# Patient Record
Sex: Male | Born: 1950 | Race: Black or African American | Hispanic: No | Smoking: Former smoker
Health system: Southern US, Community
[De-identification: ages and names within clinical notes are randomized; demographics above are authoritative.]

## PROBLEM LIST (undated history)

## (undated) DIAGNOSIS — M4804 Spinal stenosis, thoracic region: Secondary | ICD-10-CM

## (undated) DIAGNOSIS — K219 Gastro-esophageal reflux disease without esophagitis: Secondary | ICD-10-CM

## (undated) DIAGNOSIS — N400 Enlarged prostate without lower urinary tract symptoms: Secondary | ICD-10-CM

## (undated) DIAGNOSIS — I10 Essential (primary) hypertension: Secondary | ICD-10-CM

## (undated) DIAGNOSIS — M792 Neuralgia and neuritis, unspecified: Secondary | ICD-10-CM

## (undated) DIAGNOSIS — H269 Unspecified cataract: Secondary | ICD-10-CM

## (undated) DIAGNOSIS — E785 Hyperlipidemia, unspecified: Secondary | ICD-10-CM

## (undated) HISTORY — DX: Benign prostatic hyperplasia without lower urinary tract symptoms: N40.0

## (undated) HISTORY — DX: Hyperlipidemia, unspecified: E78.5

## (undated) HISTORY — DX: Essential (primary) hypertension: I10

## (undated) HISTORY — DX: Gastro-esophageal reflux disease without esophagitis: K21.9

## (undated) HISTORY — PX: KNEE SURGERY: SHX244

## (undated) HISTORY — PX: BACK SURGERY: SHX140

## (undated) HISTORY — DX: Unspecified cataract: H26.9

---

## 2000-07-18 ENCOUNTER — Emergency Department (HOSPITAL_COMMUNITY): Admission: EM | Admit: 2000-07-18 | Discharge: 2000-07-18 | Payer: Self-pay | Admitting: Emergency Medicine

## 2009-01-13 IMAGING — CR DG ANKLE COMPLETE 3+V*L*
1 series · 4 of 4 positions shown · non-contrast
Comparison: none

REASON FOR EXAM: one leg shoter than the other pain when standinf DDS fax
[CR]-[PHONE_NUMBER]
COMMENTS:

PROCEDURE:     DXR - DXR ANKLE LEFT COMPLETE  - [DATE] [DATE]
RESULT:     No fracture, dislocation or other acute bony abnormality is
identified. The ankle mortise is well maintained.

[Series 1: view not recorded · 0.17mm/px · 4 of 4 slices shown]
[im 1/4]
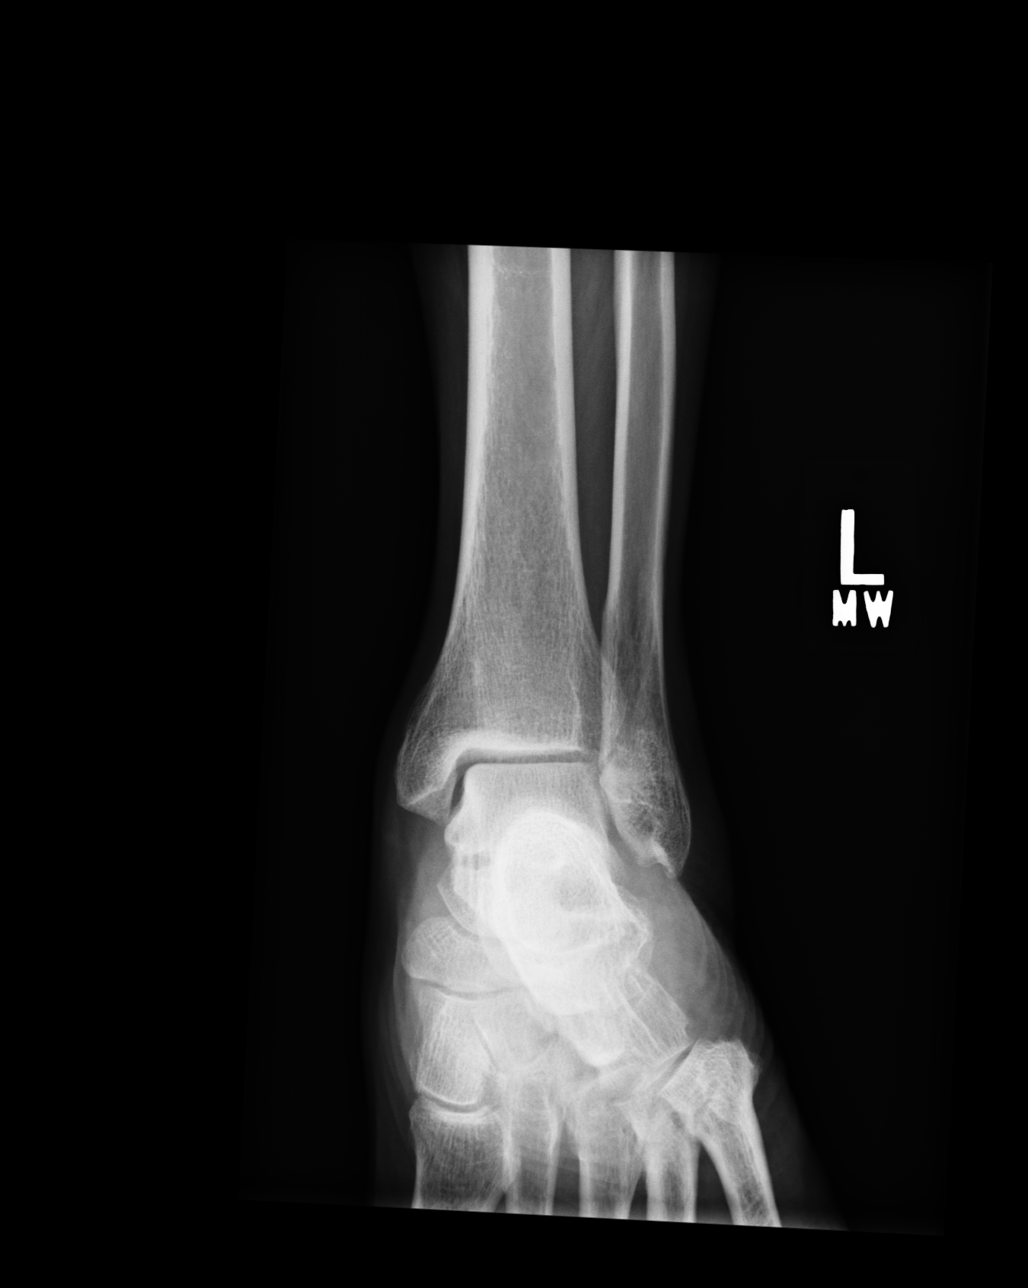
[im 2/4]
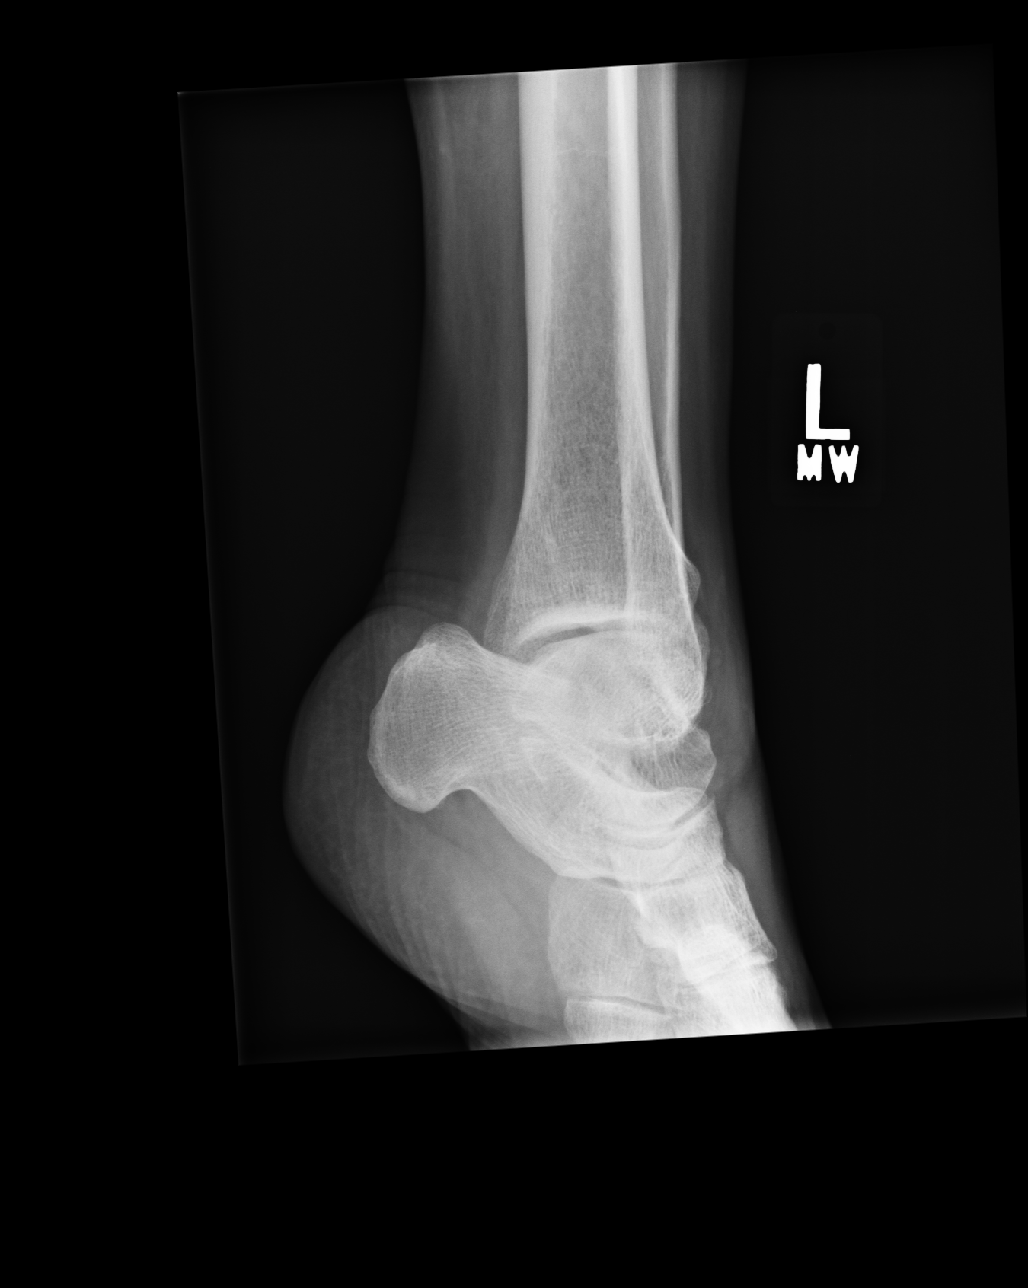
[im 3/4]
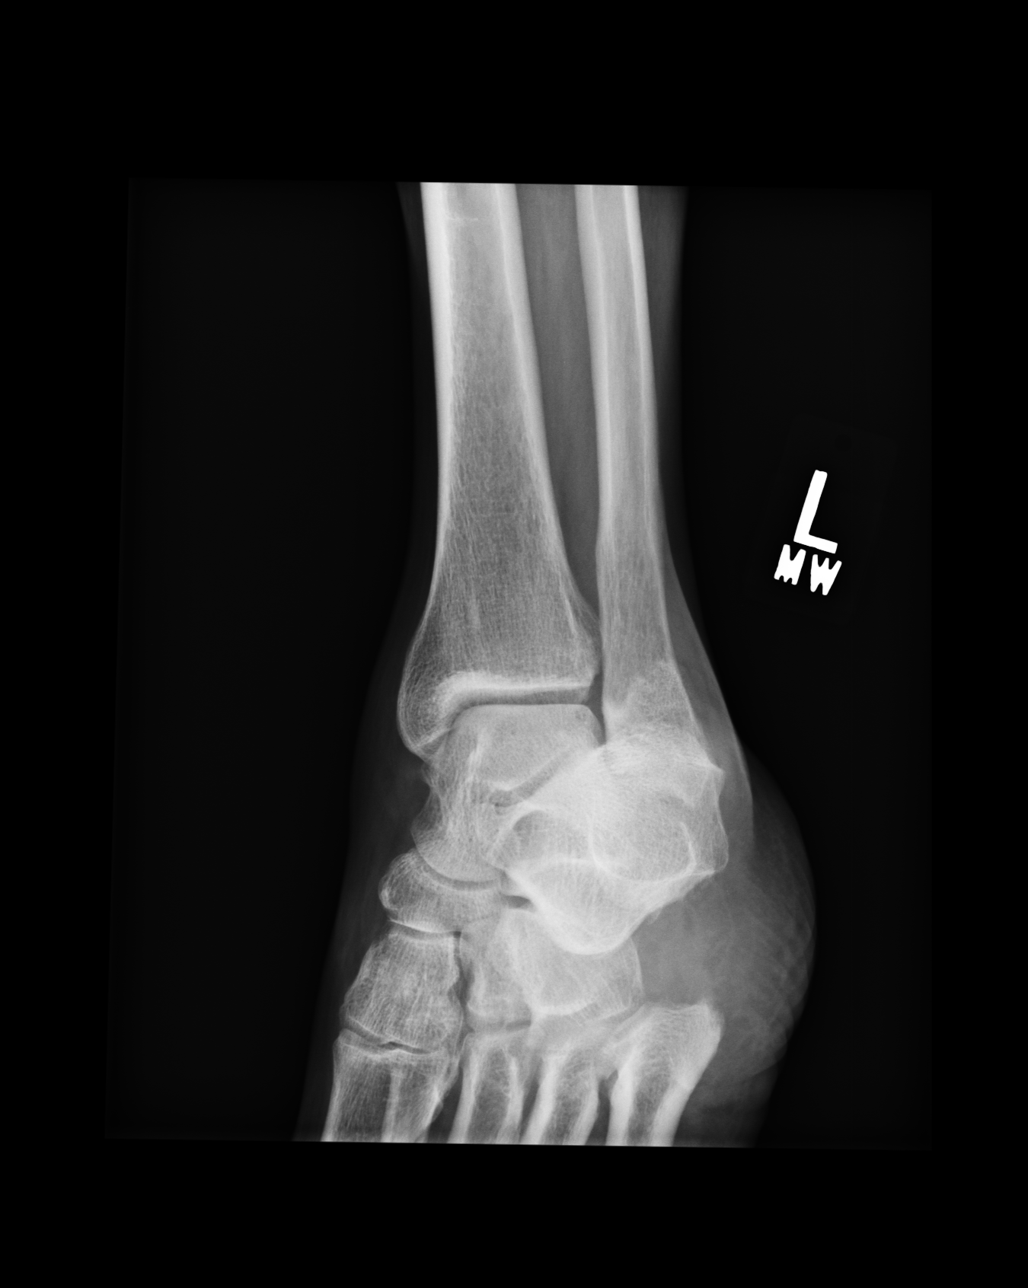
[im 4/4]
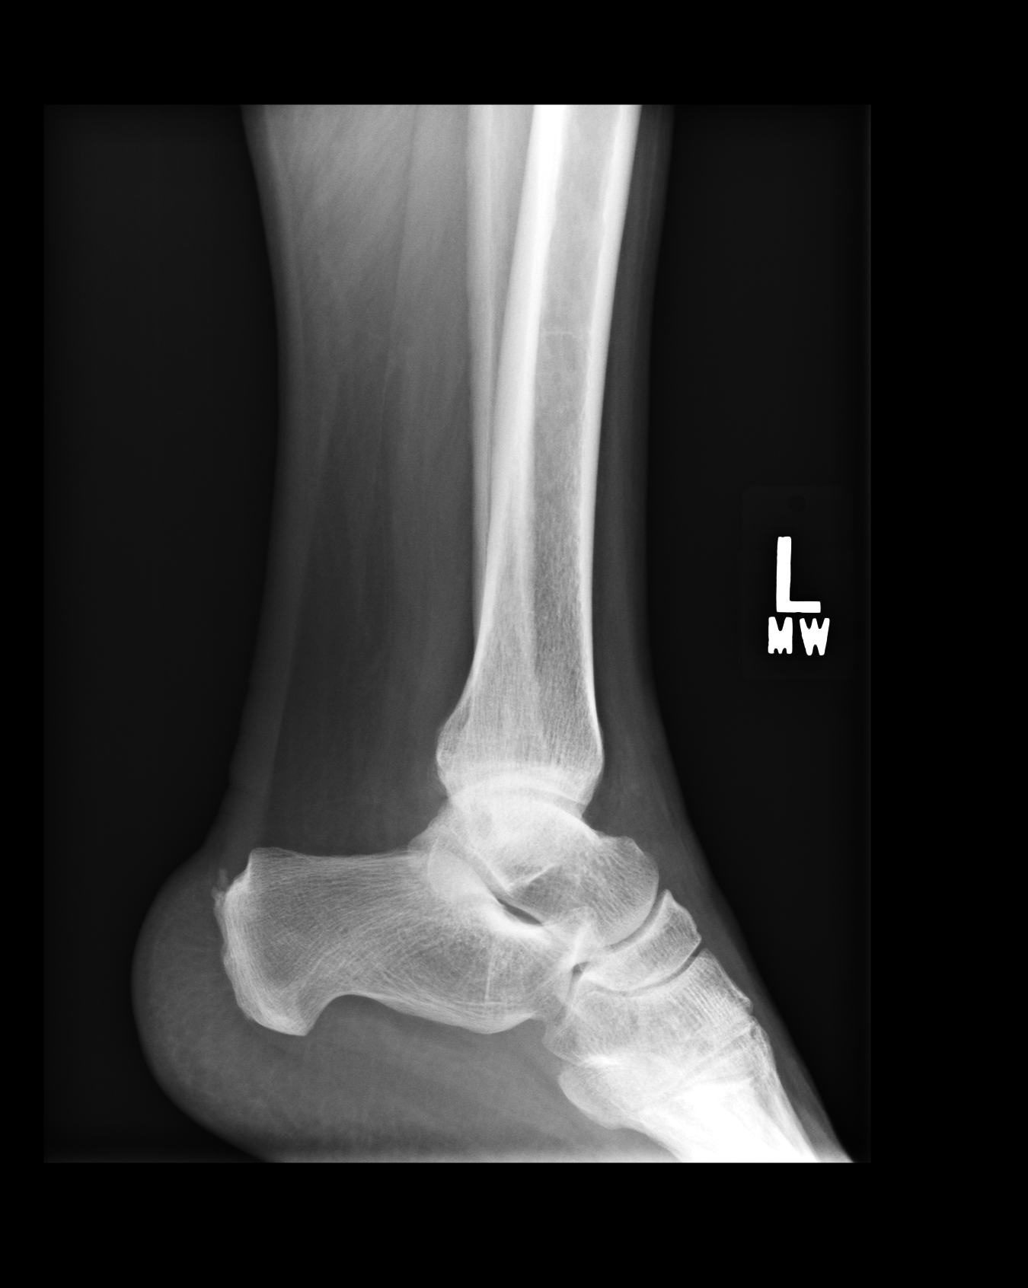

[4 of 4 positions shown; findings below may reference images not displayed]

IMPRESSION: No significant osseous abnormalities are noted.

## 2009-07-21 ENCOUNTER — Ambulatory Visit: Payer: Self-pay

## 2009-07-21 IMAGING — CR DG LUMBAR SPINE 2-3V
1 series · 4 of 4 positions shown · non-contrast
Comparison: none

REASON FOR EXAM: one leg shoter than the other pain when standinf DDS fax
[[id number]]-[PHONE_NUMBER]
COMMENTS:

[Series 1: view not recorded · 0.17mm/px · 4 of 4 slices shown]
[im 1/4]
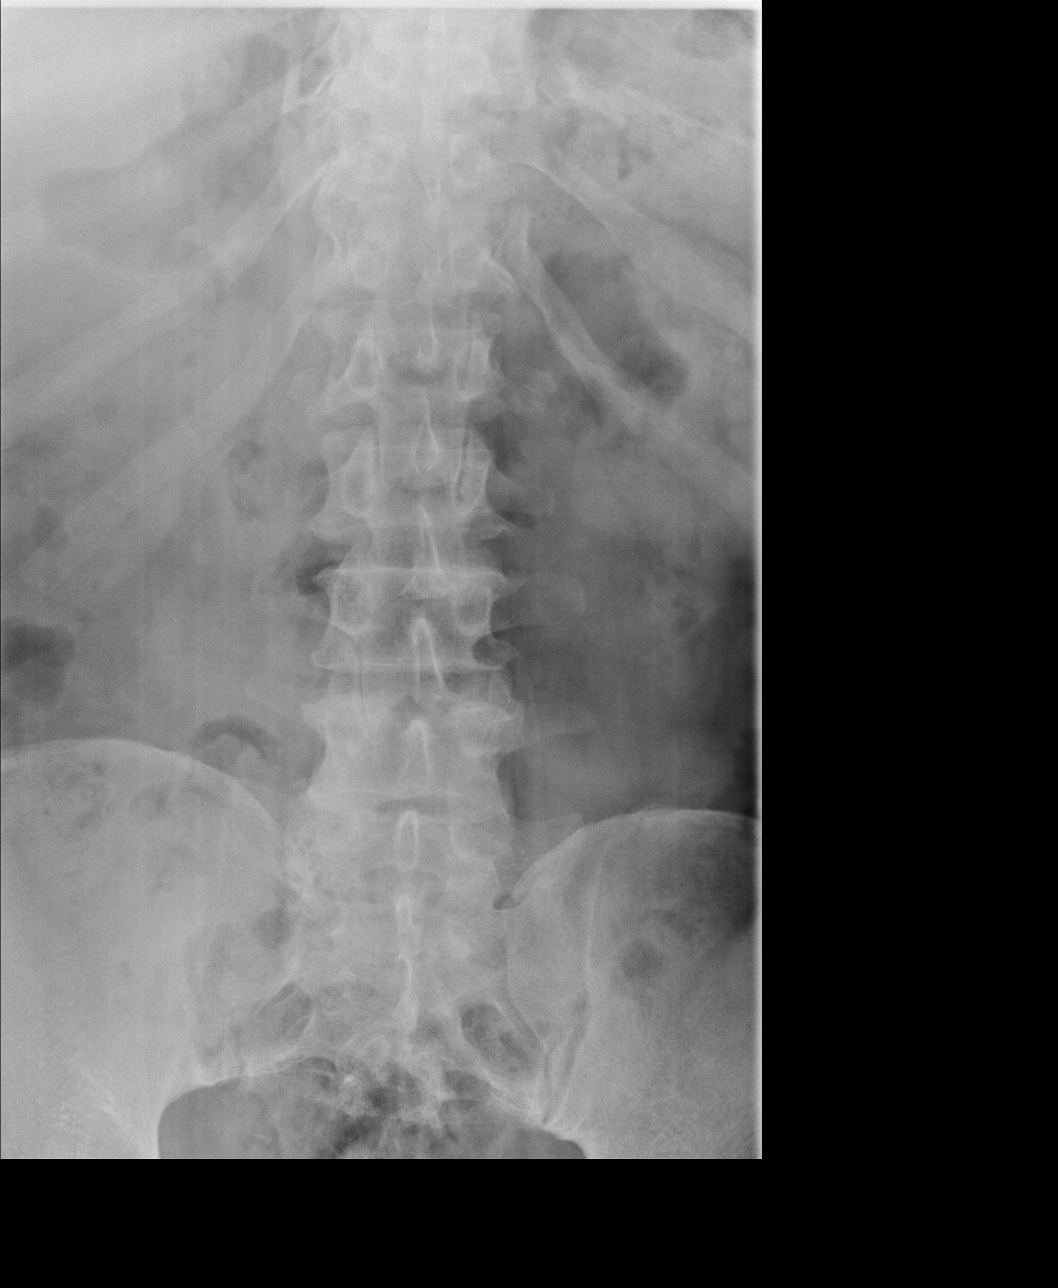
[im 2/4]
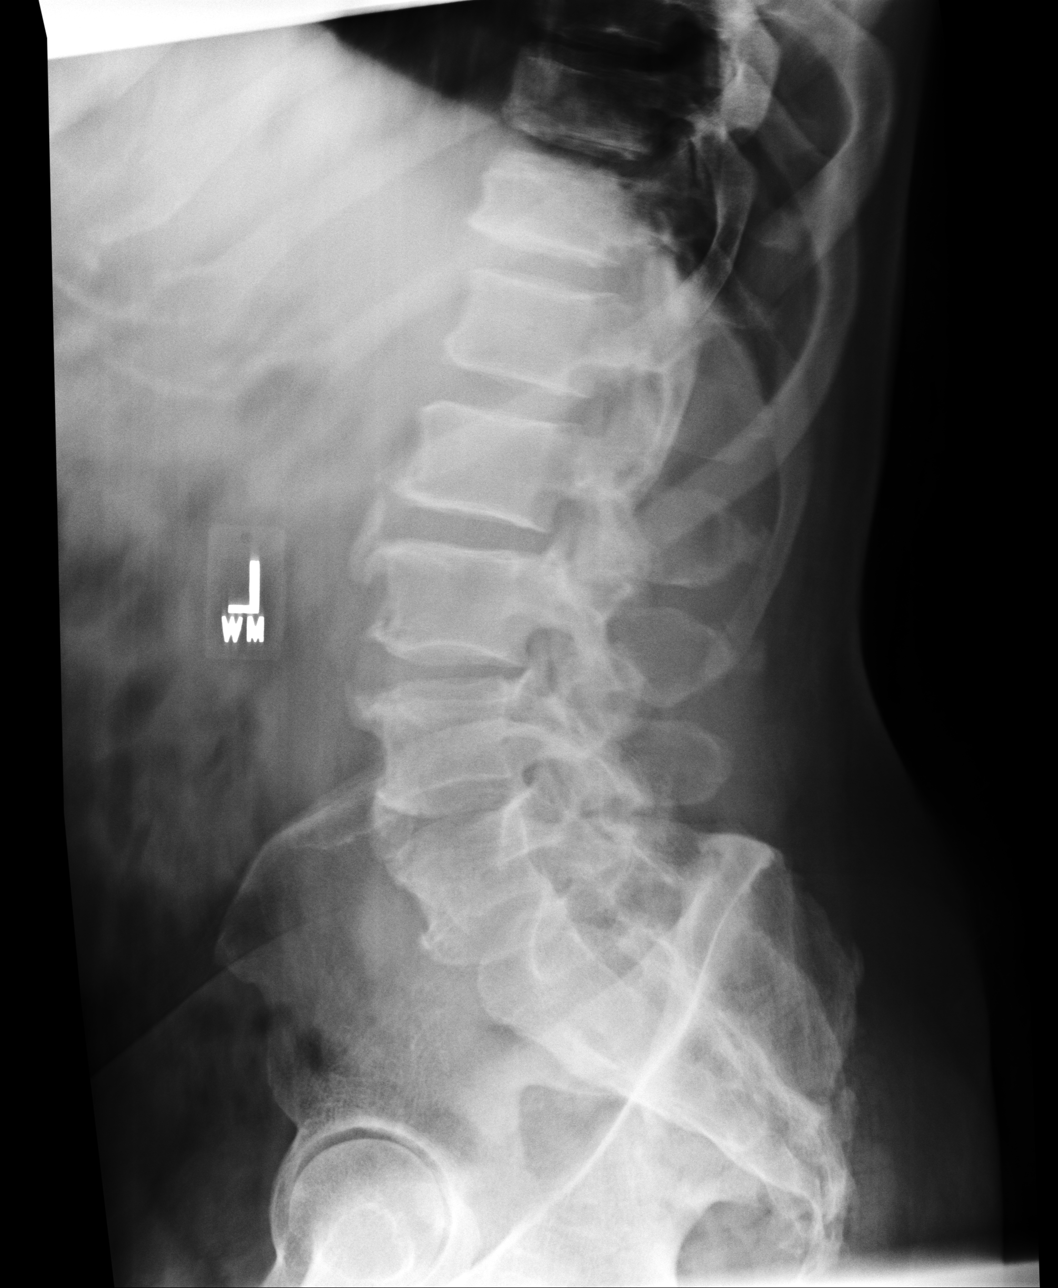
[im 3/4]
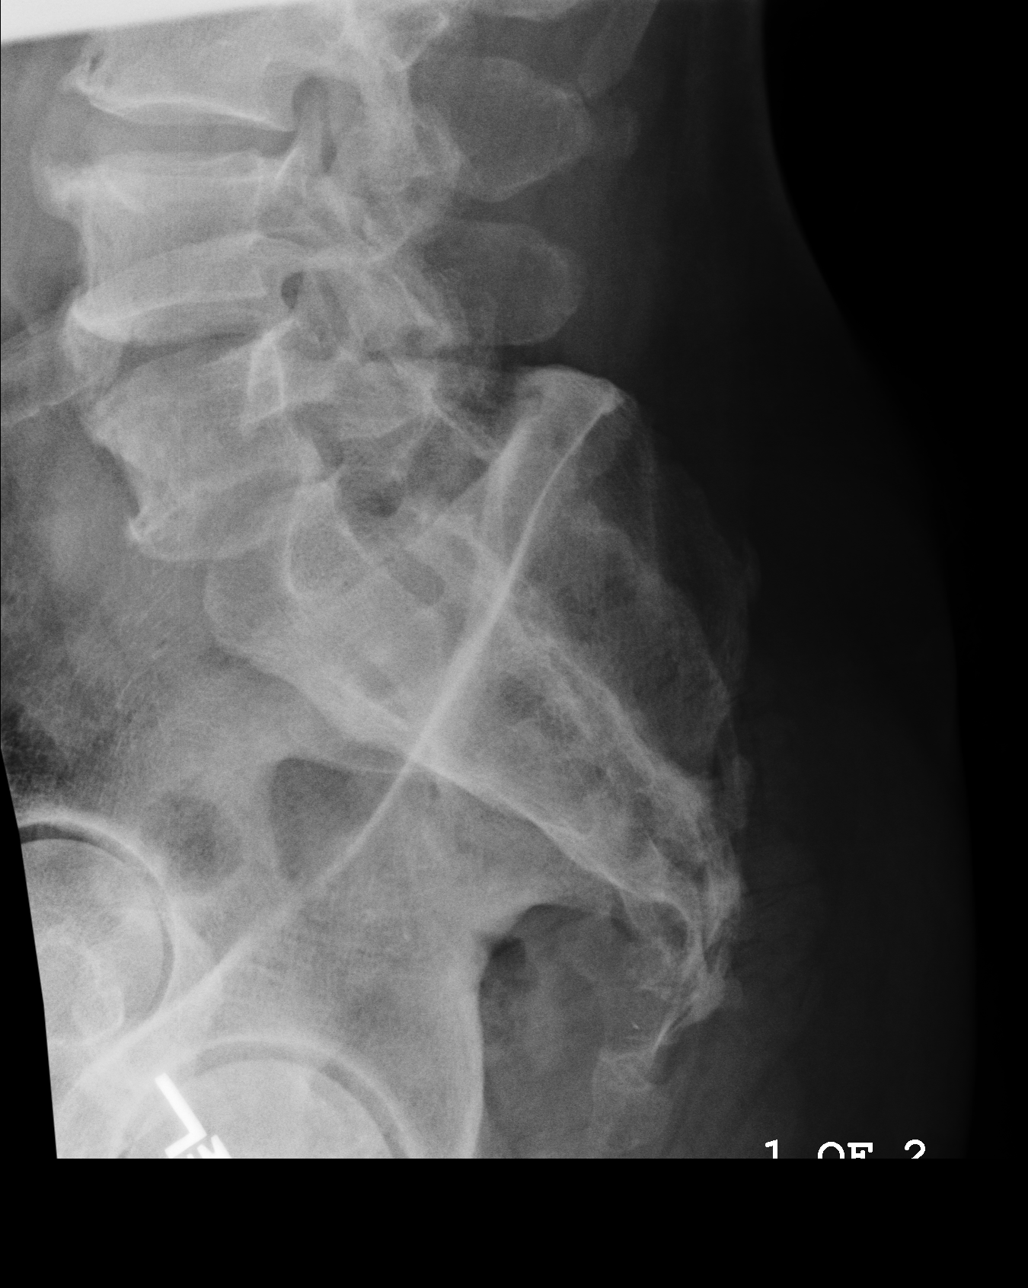
[im 4/4]
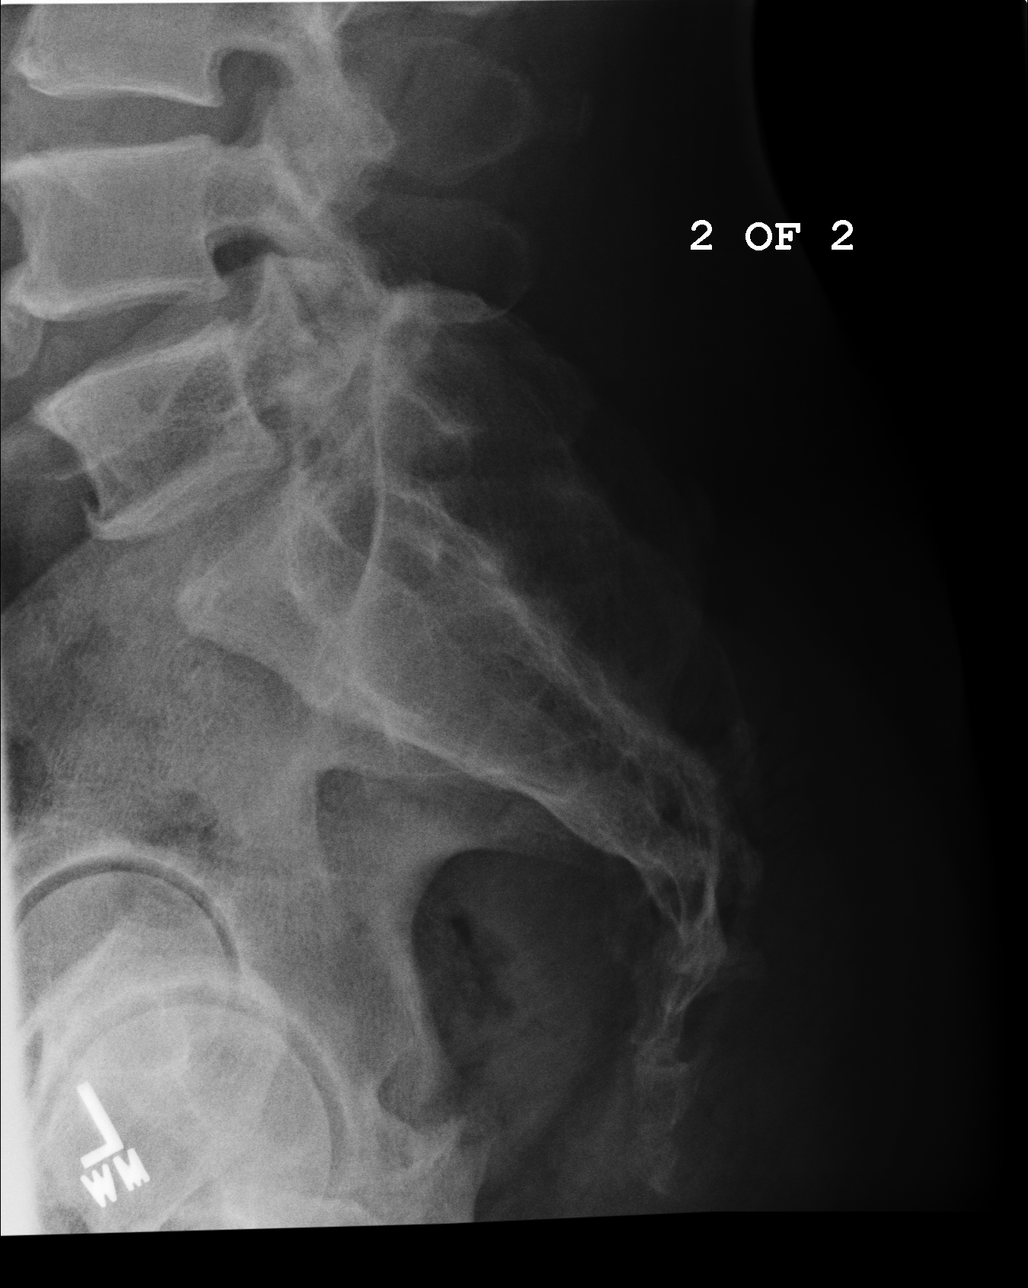

[4 of 4 positions shown; findings below may reference images not displayed]

PROCEDURE:     DXR - DXR LUMBAR SPINE AP AND LATERAL  - [DATE] [DATE]

RESULT:     The vertebral body heights and the intervertebral disc spaces
are well maintained. The vertebral body alignment is normal. There is
moderate hypertrophic spurring at multiple levels of the lower lumbar spine.
The pedicles are bilaterally intact. No lumbar scoliosis is seen. In the
current exam, the right ilium is approximately 2.8 cm higher in position
than the left.
IMPRESSION: 1.  No fracture or other acute bony abnormality is seen.
2.  No lumbar scoliosis is seen.
3.  The right iliac crest is approximately 2.8 cm higher in position than
the left.

## 2009-07-21 IMAGING — CR DG FOOT 2V*L*
1 series · 2 of 2 positions shown · non-contrast
Comparison: none

REASON FOR EXAM: one leg shoter than the other pain when standinf DDS fax
[[id number]]-[PHONE_NUMBER]
COMMENTS:

PROCEDURE:     DXR - DXR FOOT LEFT AP AND LATERAL  - [DATE] [DATE]
RESULT:     Two views of the foot are obtained. No fracture, dislocation or
other acute bony abnormality is seen. No congenital osseous abnormality of
the foot is observed.

[Series 1: view not recorded · 0.17mm/px · 2 of 2 slices shown]
[im 1/2]
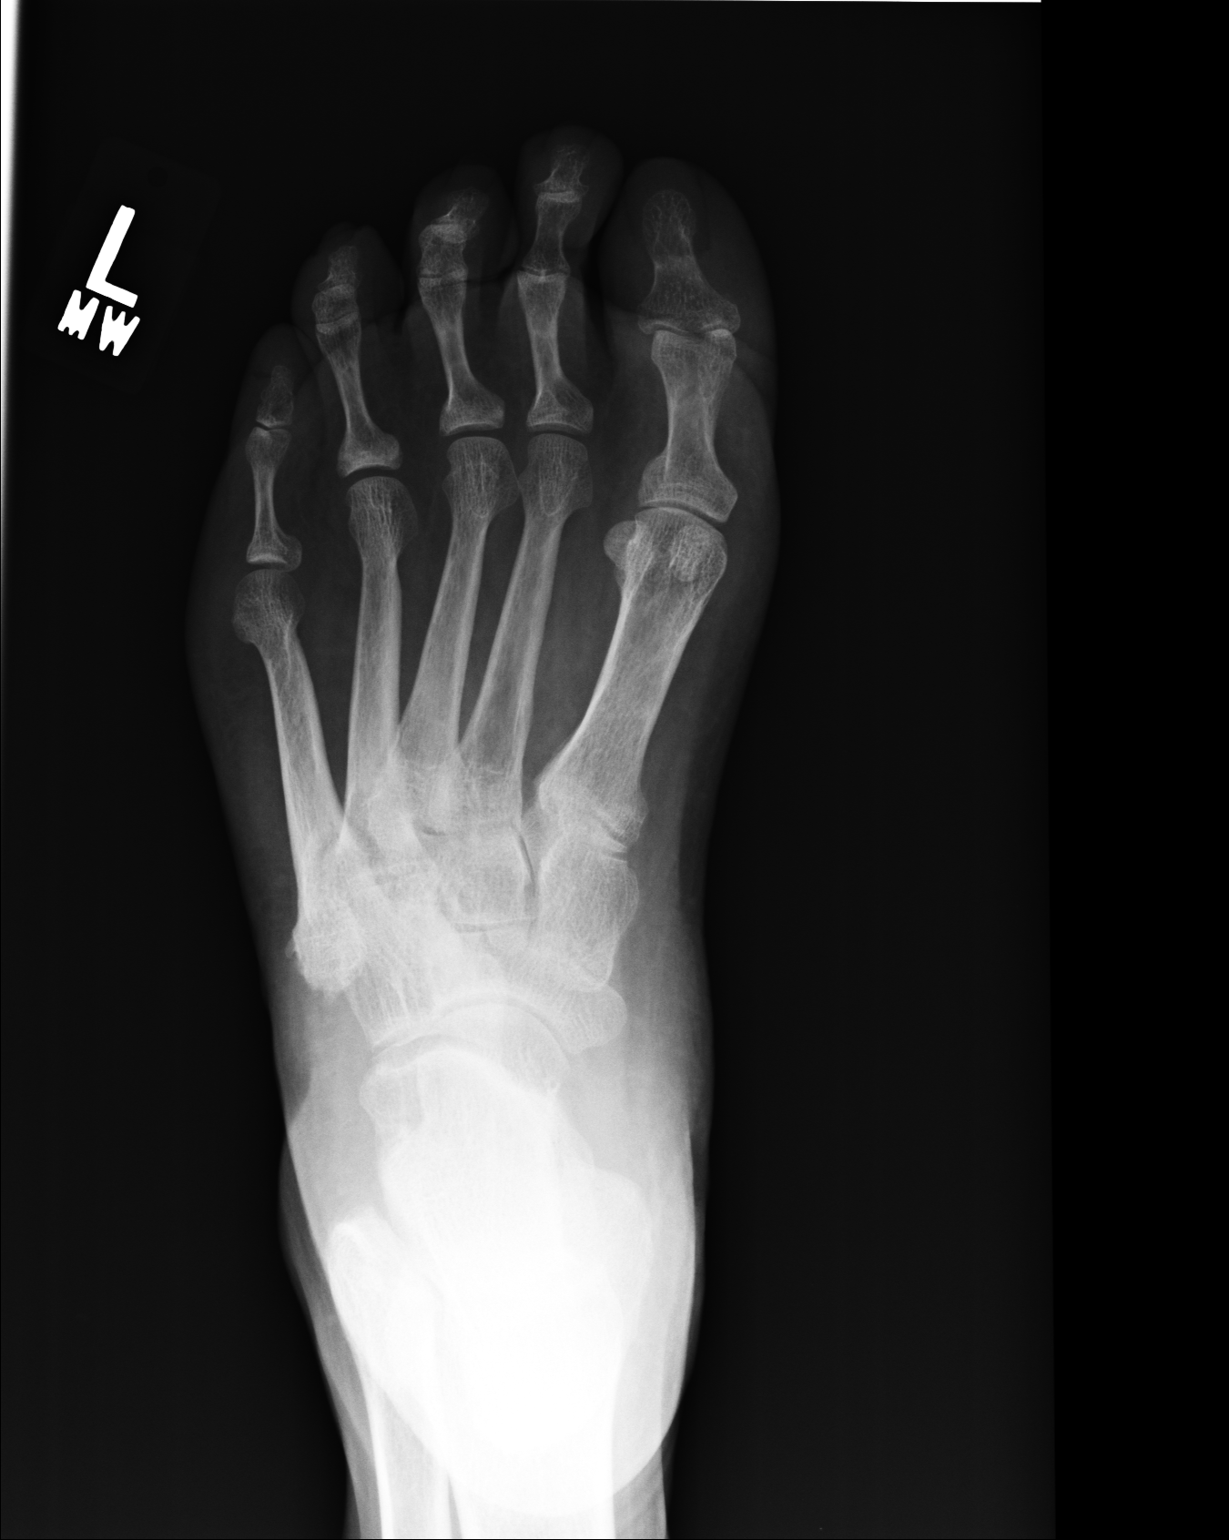
[im 2/2]
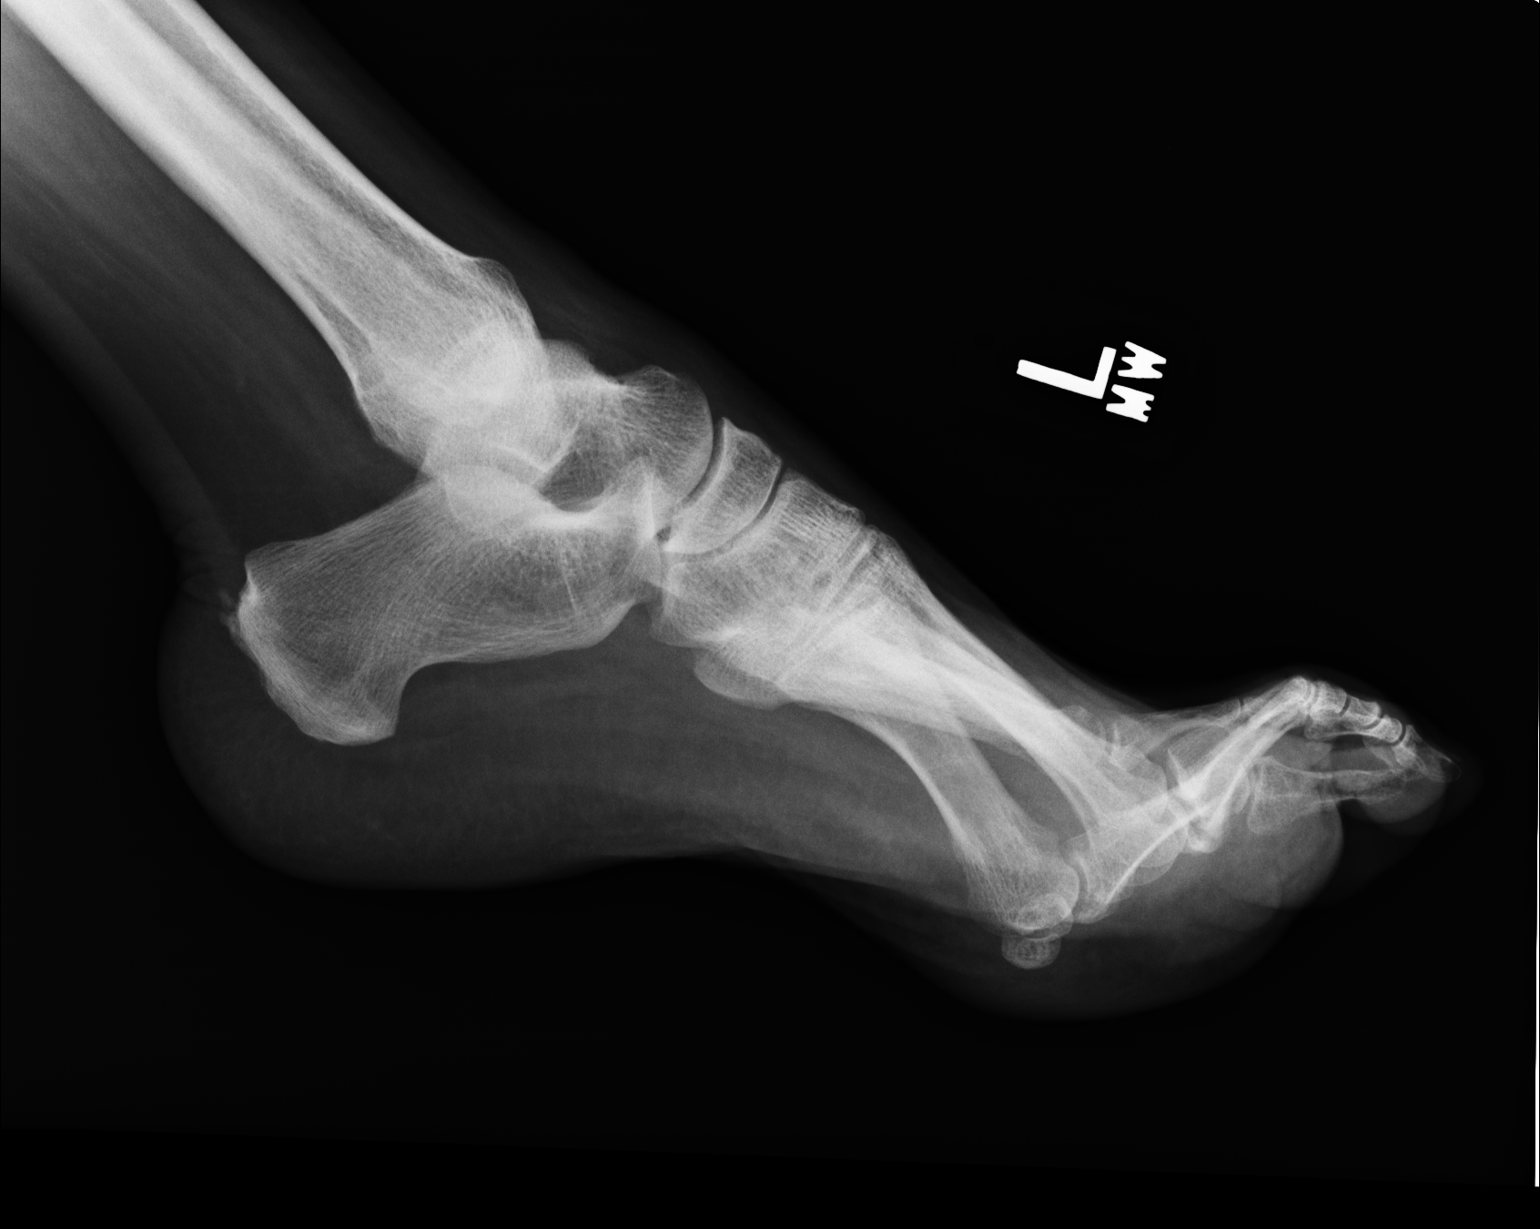

[2 of 2 positions shown; findings below may reference images not displayed]

IMPRESSION: No significant abnormalities are noted.

## 2014-09-30 ENCOUNTER — Ambulatory Visit: Payer: Medicare Other | Admitting: Internal Medicine

## 2016-03-10 DIAGNOSIS — H2513 Age-related nuclear cataract, bilateral: Secondary | ICD-10-CM | POA: Diagnosis not present

## 2016-03-10 DIAGNOSIS — H40033 Anatomical narrow angle, bilateral: Secondary | ICD-10-CM | POA: Diagnosis not present

## 2016-06-17 ENCOUNTER — Encounter (HOSPITAL_COMMUNITY): Payer: Self-pay | Admitting: *Deleted

## 2016-06-17 ENCOUNTER — Ambulatory Visit (HOSPITAL_COMMUNITY)
Admission: EM | Admit: 2016-06-17 | Discharge: 2016-06-17 | Disposition: A | Discharge status: Home / Self Care | Payer: Medicare Other | Attending: Internal Medicine | Admitting: Internal Medicine

## 2016-06-17 DIAGNOSIS — H109 Unspecified conjunctivitis: Secondary | ICD-10-CM

## 2016-06-17 MED ORDER — POLYMYXIN B-TRIMETHOPRIM 10000-0.1 UNIT/ML-% OP SOLN: 1.0000 [drp] | OPHTHALMIC | 0 refills | Status: AC

## 2016-06-17 NOTE — Discharge Instructions (Signed)
You have bacterial conjunctivitis of both eyes, please take the topical antibiotic eyedrop as prescribed. Please stop rubbing on your eyes. Please follow up with your primary care doctor next Wednesday as scheduled for your annual physical. Be sure to tell your PCP if your eyes remain bothersome.  Please mention to your PCP at your next appointment that your BP is elevated in our Urgent Care today.

## 2016-06-17 NOTE — ED Triage Notes (Signed)
Pt  Has  Irritation   And  Redness   Of  Both  Eyes        With      Drainage    Present   X  3-4     Days     Pt  Has  Been taking   otc   Cold  meds    For    3-4  Days        With   Cough   Congestion  And      Chills   Runny  Nose        Hands   Have  Been  Tinging       And  bp   Was  Elevated

## 2016-06-17 NOTE — ED Provider Notes (Signed)
CSN: [id number]     Arrival date & time 06/17/16  1209 History   First MD Initiated Contact with Patient 06/17/16 1246     Chief Complaint  Patient presents with  . Eye Problem   (Consider location/radiation/quality/duration/timing/severity/associated sxs/prior Treatment) Patient endorses eye drainage, itchiness, and some blurry vision. Reports that he has been rubbing on his eyes. The redness started initially on the left eye and then later progressed to the right eye. Have been using OTC eye drop for red eye but no improvement was noted. Patient reports that he wakes up in the morning with eyes matted shut.   BP repeated in room and is 132/96. He is otherwise asymptomatic without headache, CP, SOB, dizziness.      Conjunctivitis  This is a new problem. The current episode started more than 2 days ago. The problem occurs constantly. The problem has been gradually worsening. Pertinent negatives include no chest pain, no abdominal pain, no headaches and no shortness of breath. Nothing aggravates the symptoms. Nothing relieves the symptoms. He has tried nothing for the symptoms.    History reviewed. No pertinent past medical history. Past Surgical History:  Procedure Laterality Date  . KNEE SURGERY     History reviewed. No pertinent family history. Social History  Substance Use Topics  . Smoking status: Former Research scientist (life sciences)  . Smokeless tobacco: Not on file  . Alcohol use Yes    Review of Systems  Constitutional:       As stated in history of present illness.  Respiratory: Negative for shortness of breath.   Cardiovascular: Negative for chest pain.  Gastrointestinal: Negative for abdominal pain.  Neurological: Negative for headaches.    Allergies  Penicillins  Home Medications   Prior to Admission medications   Not on File   Meds Ordered and Administered this Visit  Medications - No data to display  BP 178/97 (BP Location: Left Arm)   Pulse 78   Temp 98.7 F (37.1 C)  (Oral)   Resp 18   SpO2 98%  No data found.   Physical Exam  Constitutional: He is oriented to person, place, and time. He appears well-developed and well-nourished.  HENT:  Head: Normocephalic and atraumatic.  Right Ear: External ear normal.  Left Ear: External ear normal.  Nose: Nose normal.  Mouth/Throat: Oropharynx is clear and moist. No oropharyngeal exudate.  TM pearly gray bilaterally with no erythema.  Eyes: EOM and lids are normal. Pupils are equal, round, and reactive to light. Right conjunctiva is injected. Left conjunctiva is injected.  Neck: Normal range of motion. Neck supple.  Cardiovascular: Normal rate, regular rhythm and normal heart sounds.   Pulmonary/Chest: Effort normal and breath sounds normal. No respiratory distress.  Lymphadenopathy:    He has no cervical adenopathy.  Neurological: He is alert and oriented to person, place, and time.  Skin: Skin is warm and dry.  Nursing note and vitals reviewed.   Urgent Care Course     Procedures (including critical care time)  Labs Review Labs Reviewed - No data to display  Imaging Review No results found.  MDM   1. Bacterial conjunctivitis of both eyes    RX for polytrim given. Reviewed directions for usage and side effects. Patient states understanding and will call with questions or problems. Patient will f/u with his PCP next Wednesday as scheduled for his Annual Physical and will f/u with his PCP as scheduled to f/u on this conjunctivitis.    Barry Dienes, NP 06/17/16 1304

## 2017-02-28 DIAGNOSIS — Z23 Encounter for immunization: Secondary | ICD-10-CM | POA: Diagnosis not present

## 2018-01-25 ENCOUNTER — Ambulatory Visit: Payer: Self-pay | Admitting: Family Medicine

## 2019-11-17 ENCOUNTER — Other Ambulatory Visit: Payer: Self-pay

## 2019-11-17 ENCOUNTER — Telehealth: Payer: Self-pay | Admitting: Neurology

## 2019-11-17 ENCOUNTER — Encounter: Payer: Self-pay | Admitting: Neurology

## 2019-11-17 ENCOUNTER — Ambulatory Visit (INDEPENDENT_AMBULATORY_CARE_PROVIDER_SITE_OTHER): Payer: Medicare Other | Admitting: Neurology

## 2019-11-17 VITALS — BP 129/94 | HR 109 | Ht 64.0 in | Wt 247.0 lb

## 2019-11-17 DIAGNOSIS — R2689 Other abnormalities of gait and mobility: Secondary | ICD-10-CM | POA: Diagnosis not present

## 2019-11-17 DIAGNOSIS — G8194 Hemiplegia, unspecified affecting left nondominant side: Secondary | ICD-10-CM

## 2019-11-17 NOTE — Progress Notes (Addendum)
HISTORICAL  Luis Clark, is a 69 years old male seen in request by his orthopedic surgeon Dr. Gladstone Lighter for evaluation of left leg weakness, numbness, initial evaluation is on November 17, 2019, he is accompanied by his significant others at today's visit.  I reviewed and summarized the referring note.  Past medical history of Hypertension, taking Norvasc 5 mg daily Hyperlipidemia, Lipitor 20 mg daily Acid reflux, omeprazole 20 mg daily  He is a poor historian, reported lifelong history of left side difficulty, he was enlisted in the [employer], was told he his left leg is shorter, he had no trouble walking, but he did reported with prolonged running, he would notice difficulty, his friend who has known him for 20 years, noted that he has a limp gait for many years.  His left side difficulty gradually getting worse, especially since last 5 years, he noticed increased left lack numbness weakness, numbness involving whole left leg from hip, left leg weakness, difficulty picking up against gravity.,  He also noticed worsening left arm weakness, numbness, he denies speech difficulty, no dysphagia, no bowel and bladder incontinence,  Personally reviewed MRI of lumbar spine from [facility] on December 27, 2019, multilevel lumbar degenerative changes 311 2 L1 partial demonstration of bilateral posterolateral and the fracture foraminal disc protrusion with squatting effacement probable significant foraminal stenosis bilaterally, L4-5 moderate central and left foraminal stenosis, variable degree of foraminal stenosis at the other levels  REVIEW OF SYSTEMS: Full 14 system review of systems performed and notable only for as above All other review of systems were negative.  ALLERGIES: Allergies  Allergen Reactions  . Penicillins     HOME MEDICATIONS: Current Outpatient Medications  Medication Sig Dispense Refill  . amLODipine (NORVASC) 5 MG tablet Take 5 mg by mouth daily.    Marland Kitchen atorvastatin  (LIPITOR) 20 MG tablet Take 20 mg by mouth daily.    Marland Kitchen LUMIGAN 0.01 % SOLN     . Multiple Vitamin (MULTIVITAMIN) tablet Take 1 tablet by mouth daily.    Marland Kitchen omeprazole (PRILOSEC) 20 MG capsule Take 20 mg by mouth daily.    . tamsulosin (FLOMAX) 0.4 MG CAPS capsule Take 0.4 mg by mouth daily.     No current facility-administered medications for this visit.    PAST MEDICAL HISTORY: Past Medical History:  Diagnosis Date  . Cataract   . Enlarged prostate   . GERD (gastroesophageal reflux disease)   . HLD (hyperlipidemia)   . Hypertension     PAST SURGICAL HISTORY: none FAMILY HISTORY: No family history on file.  SOCIAL HISTORY: Social History   Socioeconomic History  . Marital status: Legally Separated    Spouse name: Not on file  . Number of children: Not on file  . Years of education: Not on file  . Highest education level: Not on file  Occupational History  . Not on file  Tobacco Use  . Smoking status: Former Smoker    Years: 40.00  . Smokeless tobacco: Never Used  Substance and Sexual Activity  . Alcohol use: Yes  . Drug use: Not Currently  . Sexual activity: Not on file  Other Topics Concern  . Not on file  Social History Narrative  . Not on file   Social Determinants of Health   Financial Resource Strain:   . Difficulty of Paying Living Expenses:   Food Insecurity:   . Worried About Charity fundraiser in the Last Year:   . [employer]  in the Last Year:   Transportation Needs:   . Film/video editor (Medical):   Marland Kitchen Lack of Transportation (Non-Medical):   Physical Activity:   . Days of Exercise per Week:   . Minutes of Exercise per Session:   Stress:   . Feeling of Stress :   Social Connections:   . Frequency of Communication with Friends and Family:   . Frequency of Social Gatherings with Friends and Family:   . Attends Religious Services:   . Active Member of Clubs or Organizations:   . Attends Archivist Meetings:   Marland Kitchen Marital  Status:   Intimate Partner Violence:   . Fear of Current or Ex-Partner:   . Emotionally Abused:   Marland Kitchen Physically Abused:   . Sexually Abused:      PHYSICAL EXAM   Vitals:   11/17/19 1353  BP: (!) 129/94  Pulse: (!) 109  Weight: 247 lb (112 kg)  Height: 5\' 4"  (1.626 m)   Not recorded     Body mass index is 42.4 kg/m.  PHYSICAL EXAMNIATION:  Gen: NAD, conversant, well nourised, well groomed                     Cardiovascular: Regular rate rhythm, no peripheral edema, warm, nontender. Eyes: Conjunctivae clear without exudates or hemorrhage Neck: Supple, no carotid bruits. Pulmonary: Clear to auscultation bilaterally   NEUROLOGICAL EXAM:  MENTAL STATUS: Speech:    Speech is normal; fluent and spontaneous with normal comprehension.  Cognition:     Orientation to time, place and person     Normal recent and remote memory     Normal Attention span and concentration     Normal Language, naming, repeating,spontaneous speech     Fund of knowledge   CRANIAL NERVES: CN II: Visual fields are full to confrontation. Pupils are round equal and briskly reactive to light. CN III, IV, VI: extraocular movement are normal. No ptosis. CN V: Facial sensation is intact to light touch CN VII: Face is symmetric with normal eye closure  CN VIII: Hearing is normal to causal conversation. CN IX, X: Phonation is normal. CN XI: Head turning and shoulder shrug are intact  MOTOR: Left upper extremity pronation drift, proximal strengths 4, distal strength 5 -, left hip flexion 3, knee flexion, extension 3, ankle dorsiflexion, plantarflexion 3,  REFLEXES: Hyperreflexia on the right upper and lower extremity, mildly increased reflex on left upper and lower extremity, left-sided Babinski signs,  SENSORY: Length dependent decreased light touch, vibratory sensation,  COORDINATION: There is no trunk or limb dysmetria noted.  GAIT/STANCE: He needs to push-up to get up from seated position,  dragging   DIAGNOSTIC DATA (LABS, IMAGING, TESTING) - I reviewed patient records, labs, notes, testing and imaging myself where available.   ASSESSMENT AND PLAN  Luis Clark is a 69 y.o. male   Lifelong history of left side difficulty, progressively worse, dense left lower extremity numbness, increased weakness  He has left hemiparesis on examination,  Potential localized lesion to right hemisphere, proceed with MRI of brain  MRI of lumbar spine also showed partial evaluation of T12, L1 stenosis, also proceed with MRI of thoracic spine  EMG nerve conduction study because of his reported dense numbness on the left lower extremity.   Marcial Pacas, M.D. Ph.D.  [facility] [address], [place], [place] [address] Ph: [phone] Fax: [phone]  CC:  Latanya Maudlin, MD [address] [place],  [place] [address]  Patient, No Pcp Per   Addendum: Neurosurgeon evaluation by Dr. Christella Noa December 09, 2019, planning on thoracic spinal stenosis decompression surgery T11-12,  CT of the head to further characterize the right frontal lesion

## 2019-11-17 NOTE — Telephone Encounter (Signed)
[employer] medicare order sent to GI. No auth they will reach out to the patient to schedule.  

## 2019-12-02 ENCOUNTER — Other Ambulatory Visit: Payer: Self-pay

## 2019-12-02 ENCOUNTER — Ambulatory Visit
Admission: RE | Admit: 2019-12-02 | Discharge: 2019-12-02 | Disposition: A | Discharge status: Home / Self Care | Payer: Medicare Other | Source: Ambulatory Visit | Attending: Neurology | Admitting: Neurology

## 2019-12-02 DIAGNOSIS — G8194 Hemiplegia, unspecified affecting left nondominant side: Secondary | ICD-10-CM

## 2019-12-02 DIAGNOSIS — R2689 Other abnormalities of gait and mobility: Secondary | ICD-10-CM

## 2019-12-04 ENCOUNTER — Telehealth: Payer: Self-pay | Admitting: Neurology

## 2019-12-04 DIAGNOSIS — G9389 Other specified disorders of brain: Secondary | ICD-10-CM | POA: Insufficient documentation

## 2019-12-04 DIAGNOSIS — J869 Pyothorax without fistula: Secondary | ICD-10-CM

## 2019-12-04 NOTE — Addendum Note (Signed)
Addended by: Marcial Pacas on: 12/04/2019 11:02 AM   Modules accepted: Orders

## 2019-12-04 NOTE — Telephone Encounter (Signed)
I spoke to the patient and provided him with the results below. He verbalized understanding and is agreeable to see neurosurgery.  I spoke to [place] and she has already sent over this urgent referral.

## 2019-12-04 NOTE — Telephone Encounter (Addendum)
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 Mass effect upon the right inferior frontal lobe, right orbit, right frontal sinus and right ethmoid sinus. No abnormal signal within brain parenchyma. 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 Other neoplastic processes such as lymphoma, plasmacytoma or metastasis less likely.  - Mild chronic small vessel ischemic disease.   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

## 2019-12-09 ENCOUNTER — Other Ambulatory Visit: Payer: Self-pay | Admitting: Neurosurgery

## 2019-12-10 ENCOUNTER — Other Ambulatory Visit: Payer: Self-pay | Admitting: Neurosurgery

## 2019-12-10 ENCOUNTER — Encounter: Payer: Self-pay | Admitting: Neurology

## 2019-12-10 ENCOUNTER — Other Ambulatory Visit: Payer: Self-pay

## 2019-12-10 ENCOUNTER — Other Ambulatory Visit
Admission: RE | Admit: 2019-12-10 | Discharge: 2019-12-10 | Disposition: A | Discharge status: Home / Self Care | Payer: Medicare Other | Source: Ambulatory Visit | Attending: Neurosurgery | Admitting: Neurosurgery

## 2019-12-10 DIAGNOSIS — Z20822 Contact with and (suspected) exposure to covid-19: Secondary | ICD-10-CM | POA: Insufficient documentation

## 2019-12-10 DIAGNOSIS — M899 Disorder of bone, unspecified: Secondary | ICD-10-CM

## 2019-12-10 DIAGNOSIS — Z01812 Encounter for preprocedural laboratory examination: Secondary | ICD-10-CM | POA: Insufficient documentation

## 2019-12-10 LAB — SARS CORONAVIRUS 2 (TAT 6-24 HRS): SARS Coronavirus 2: NEGATIVE

## 2019-12-10 NOTE — Telephone Encounter (Signed)
Referral was sent Patient has apt with 12/12/2019 please see notes via Epic

## 2019-12-11 ENCOUNTER — Encounter (HOSPITAL_COMMUNITY): Payer: Self-pay | Admitting: Anesthesiology

## 2019-12-12 ENCOUNTER — Encounter (HOSPITAL_COMMUNITY): Admission: RE | Payer: Self-pay | Source: Home / Self Care

## 2019-12-12 ENCOUNTER — Ambulatory Visit (HOSPITAL_COMMUNITY): Admission: RE | Admit: 2019-12-12 | Payer: Medicare Other | Source: Home / Self Care | Admitting: Neurosurgery

## 2019-12-12 SURGERY — LUMBAR LAMINECTOMY/DECOMPRESSION MICRODISCECTOMY 1 LEVEL
Anesthesia: General

## 2019-12-31 ENCOUNTER — Encounter: Payer: Self-pay | Admitting: Neurology

## 2020-01-06 ENCOUNTER — Ambulatory Visit
Admission: RE | Admit: 2020-01-06 | Discharge: 2020-01-06 | Disposition: A | Discharge status: Home / Self Care | Payer: Medicare Other | Source: Ambulatory Visit | Attending: Neurosurgery | Admitting: Neurosurgery

## 2020-01-06 ENCOUNTER — Other Ambulatory Visit: Payer: Self-pay

## 2020-01-06 DIAGNOSIS — R930 Abnormal findings on diagnostic imaging of skull and head, not elsewhere classified: Secondary | ICD-10-CM | POA: Diagnosis not present

## 2020-01-06 DIAGNOSIS — M899 Disorder of bone, unspecified: Secondary | ICD-10-CM | POA: Diagnosis not present

## 2020-01-06 IMAGING — CT CT HEAD W/O CM
3 series · 15 of 47 positions shown, 18 images · non-contrast
Comparison: Head MRI [DATE]

CLINICAL DATA: Follow-up calvarial lesion.

EXAM:
CT HEAD WITHOUT CONTRAST
TECHNIQUE: Contiguous axial images were obtained from the base of the skull
through the vertex without intravenous contrast.

[Series 3: head wo · axial · 0.46mm/px · z∈[-113,+12]mm · 9 of 31 slices shown, 12 images]
[im 3/31  brain]
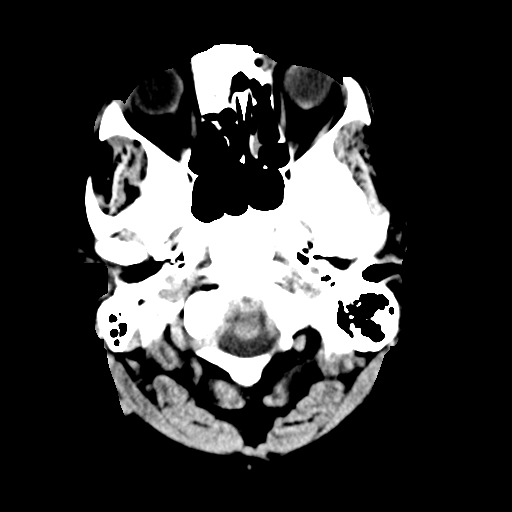
[im 3/31  bone]
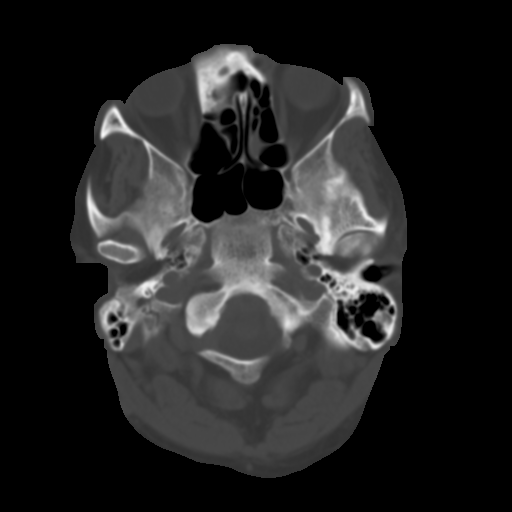
[im 6/31  brain]
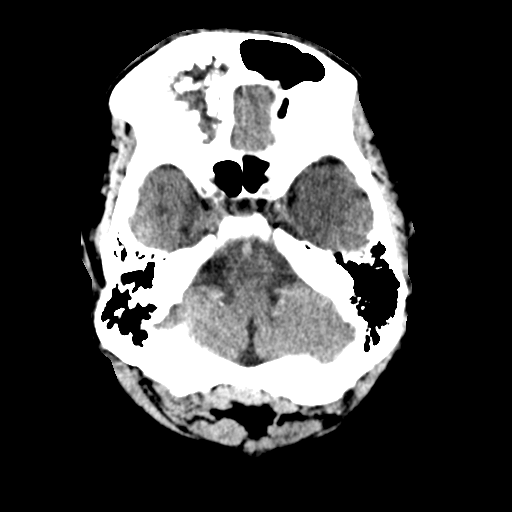
[im 9/31  brain]
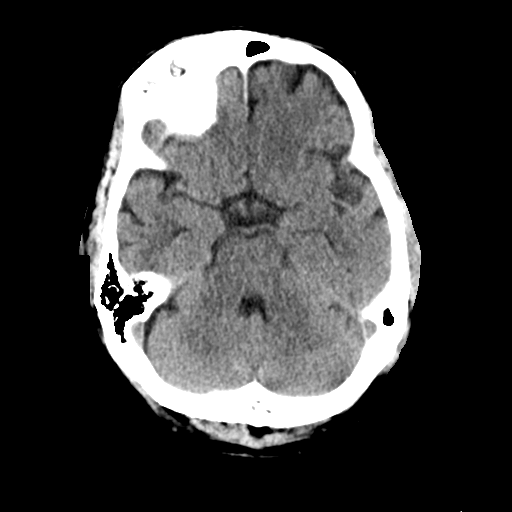
[im 12/31  brain]
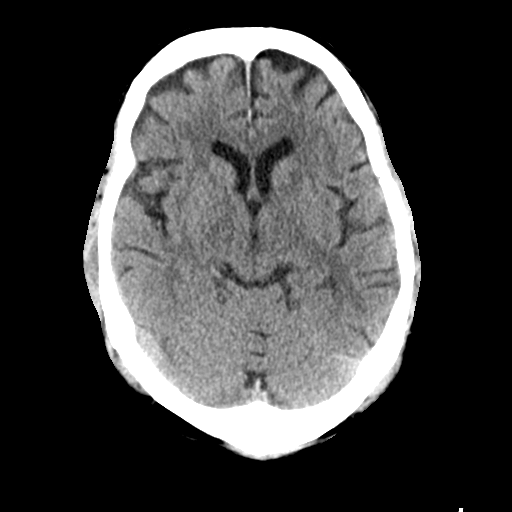
[im 16/31  brain]
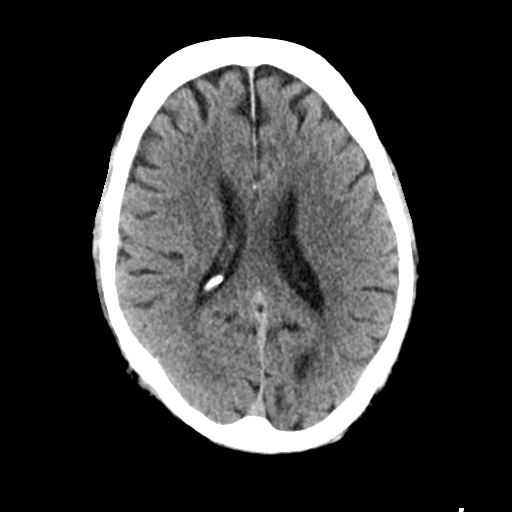
[im 16/31  bone]
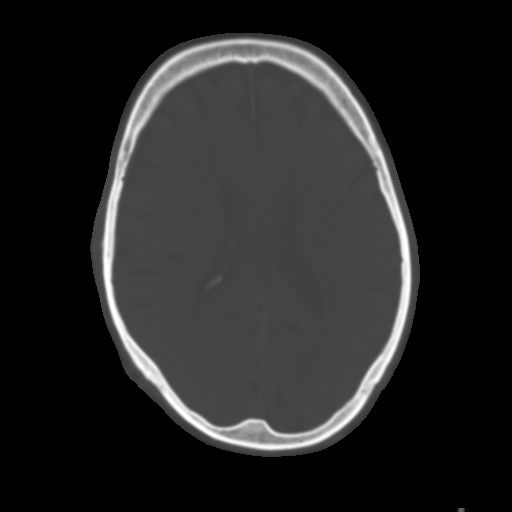
[im 19/31  brain]
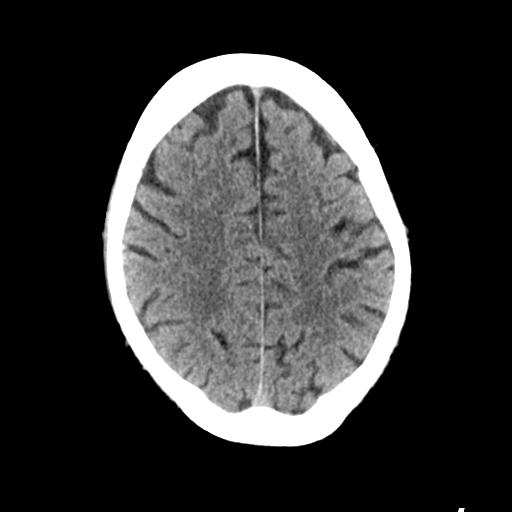
[im 22/31  brain]
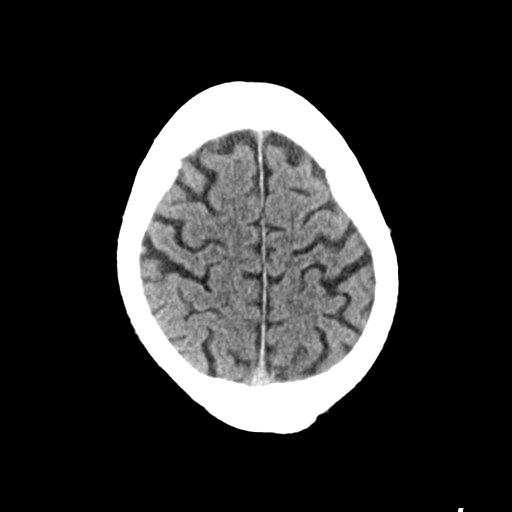
[im 25/31  brain]
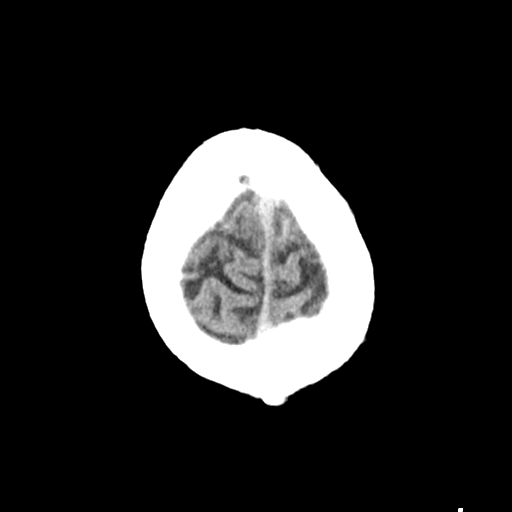
[im 28/31  brain]
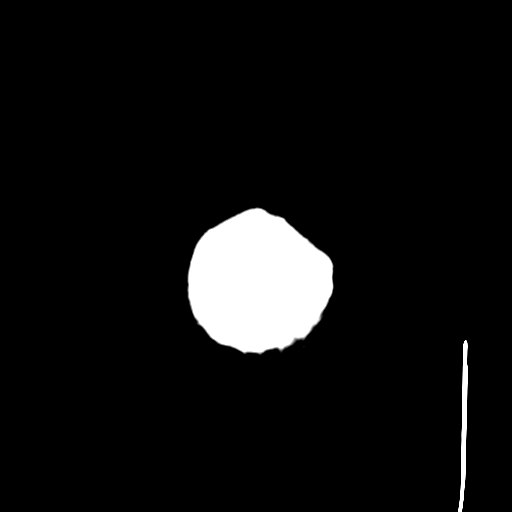
[im 28/31  bone]
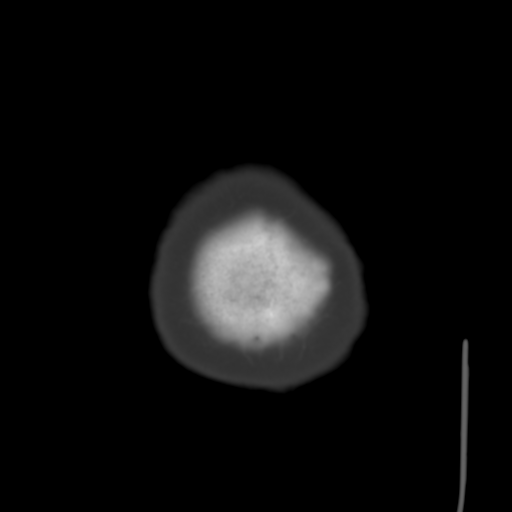

[Series 4: coronal soft tissue · coronal · 0.32mm/px · 3 of 73 slices shown]
[im 25/73  brain]
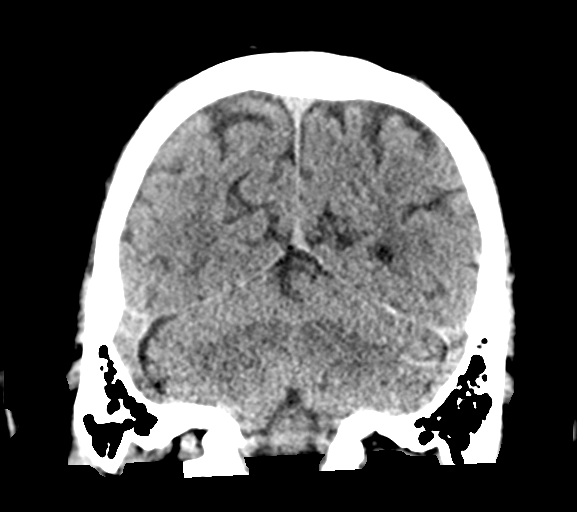
[im 33/73  brain]
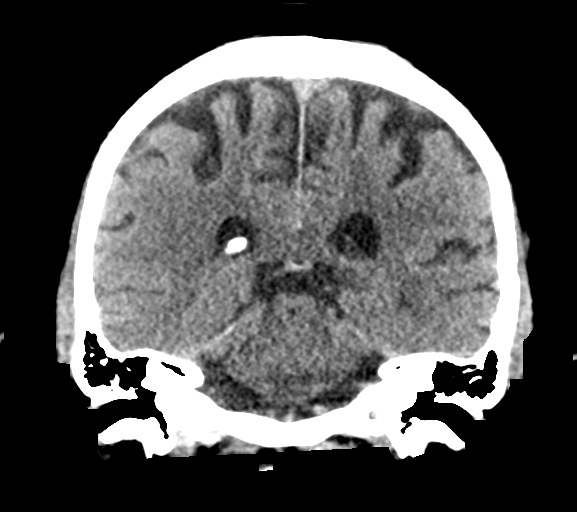
[im 41/73  brain]
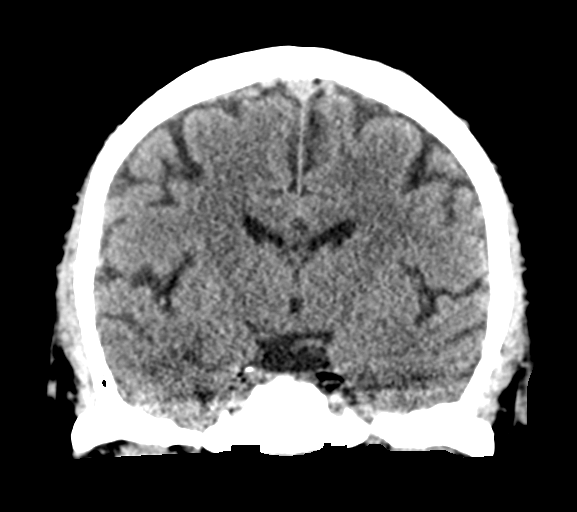

[Series 5: sagittal soft tissue · sagittal · 0.32mm/px · 3 of 63 slices shown]
[im 21/63  brain]
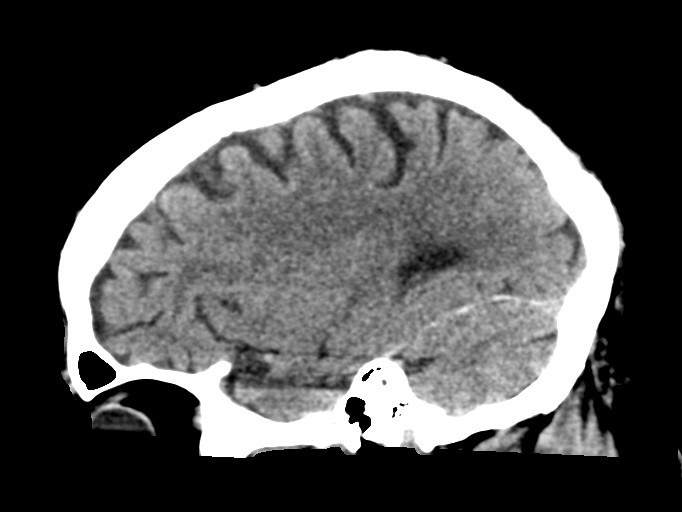
[im 32/63  brain]
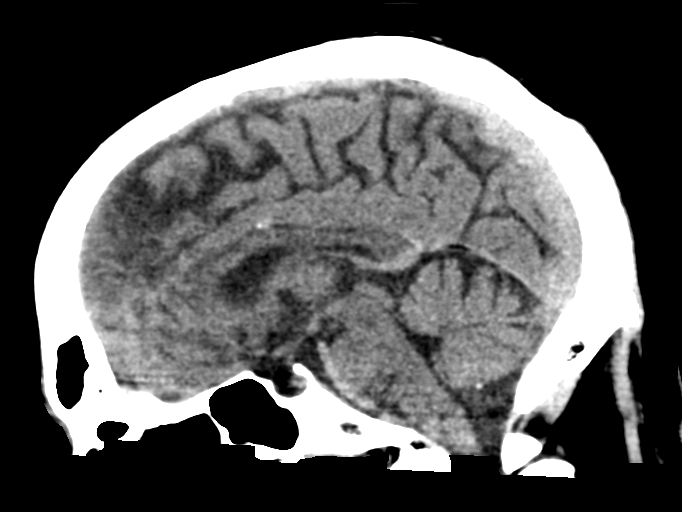
[im 42/63  brain]
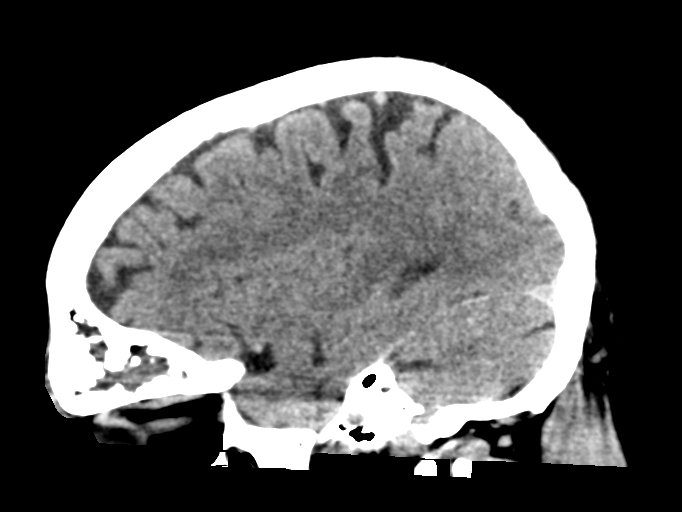

[15 of 47 positions shown; findings below may reference images not displayed]

FINDINGS: Brain: There is no evidence of an acute infarct, intracranial
hemorrhage, mass, midline shift, or extra-axial fluid collection.
The ventricles and sulci are normal. Hypodensities in the cerebral
white matter bilaterally are nonspecific but compatible with mild
chronic small vessel ischemic disease.

Vascular: Calcified atherosclerosis at the skull base. No hyperdense
vessel.

Skull: Unchanged size of right frontal skull abnormality measuring
5.3 cm in maximal dimension with prominent widening of the diploic
space, mixed lucency and sclerosis centrally, and peripheral areas
of both ground-glass density as well as dense sclerosis. Involvement
of the right orbital roof with slight mass effect on the orbital
structures including superior rectus muscle. Mild mass effect on the
undersurface of the right frontal lobe.

Sinuses/Orbits: Obliteration of the right frontal sinus by the above
described calvarial process with mild encroachment on the superior
aspect of the ethmoid air cells. Mild left ethmoid air cell mucosal
thickening. Clear mastoid air cells.

Other: None.
IMPRESSION: Right frontal skull lesion favored to reflect fibrous dysplasia.

## 2020-01-07 ENCOUNTER — Telehealth: Payer: Self-pay | Admitting: Neurology

## 2020-01-07 ENCOUNTER — Encounter: Payer: Medicare Other | Admitting: Neurology

## 2020-01-07 NOTE — Telephone Encounter (Signed)
Please cancel his EMG nerve conduction appointment with Dr. Jannifer Franklin today on January 07, 2020  Check on his schedule with neurosurgeon, per feedback from Dr. Christella Noa, is supposed to have thoracic decompression surgery,  If needed, give him a follow-up visit with me

## 2020-01-07 NOTE — Telephone Encounter (Signed)
I spoke to the patient to cancel his NCV/EMG planned for today. States he is currently working w/ Dr. Lacy Duverney office to get a surgery date scheduled.

## 2020-03-29 ENCOUNTER — Encounter: Payer: Medicare Other | Admitting: Neurology

## 2020-04-13 ENCOUNTER — Ambulatory Visit (INDEPENDENT_AMBULATORY_CARE_PROVIDER_SITE_OTHER): Payer: Medicare Other | Admitting: Neurology

## 2020-04-13 ENCOUNTER — Other Ambulatory Visit: Payer: Self-pay

## 2020-04-13 DIAGNOSIS — M8500 Fibrous dysplasia (monostotic), unspecified site: Secondary | ICD-10-CM | POA: Diagnosis not present

## 2020-04-13 DIAGNOSIS — M4804 Spinal stenosis, thoracic region: Secondary | ICD-10-CM | POA: Diagnosis not present

## 2020-04-13 DIAGNOSIS — G8194 Hemiplegia, unspecified affecting left nondominant side: Secondary | ICD-10-CM | POA: Diagnosis not present

## 2020-04-13 DIAGNOSIS — R2689 Other abnormalities of gait and mobility: Secondary | ICD-10-CM

## 2020-04-13 DIAGNOSIS — R29898 Other symptoms and signs involving the musculoskeletal system: Secondary | ICD-10-CM | POA: Diagnosis not present

## 2020-04-14 ENCOUNTER — Telehealth: Payer: Self-pay | Admitting: Neurology

## 2020-04-14 DIAGNOSIS — M4804 Spinal stenosis, thoracic region: Secondary | ICD-10-CM | POA: Insufficient documentation

## 2020-04-14 DIAGNOSIS — M8500 Fibrous dysplasia (monostotic), unspecified site: Secondary | ICD-10-CM | POA: Insufficient documentation

## 2020-04-14 DIAGNOSIS — R29898 Other symptoms and signs involving the musculoskeletal system: Secondary | ICD-10-CM | POA: Insufficient documentation

## 2020-04-14 NOTE — Telephone Encounter (Signed)
Patient is scheduled at [facility] the medical mall for Friday 04/23/20 to arrive at 10:30 am. Patient is aware of time & day.

## 2020-04-14 NOTE — Progress Notes (Signed)
HISTORICAL  Luis Clark, is a 69 years old male seen in request by his orthopedic surgeon Dr. Gladstone Lighter for evaluation of left leg weakness, numbness, initial evaluation is on November 17, 2019, he is accompanied by his significant others at today's visit.  I reviewed and summarized the referring note.  Past medical history of Hypertension, taking Norvasc 5 mg daily Hyperlipidemia, Lipitor 20 mg daily Acid reflux, omeprazole 20 mg daily  He is a poor historian, reported lifelong history of left side difficulty, he was enlisted in the [employer], was told he his left leg is shorter, he had no trouble walking, but he did reported with prolonged running, he would notice difficulty, his friend who has known him for 20 years, noted that he has a limp gait for many years.  His left side difficulty gradually getting worse, especially since last 5 years, he noticed increased left leg numbness weakness, numbness involving whole left leg from hip, left leg weakness, difficulty picking up against gravity.,  He also noticed worsening left arm weakness, numbness, he denies speech difficulty, no dysphagia, no bowel and bladder incontinence,  Personally reviewed MRI of lumbar spine from [facility] on December 27, 2019, multilevel lumbar degenerative changes 311 2 L1 partial demonstration of bilateral posterolateral and the fracture foraminal disc protrusion with squatting effacement probable significant foraminal stenosis bilaterally, L4-5 moderate central and left foraminal stenosis, variable degree of foraminal stenosis at the other levels  UPDATE Apr 13 2020: I personally reviewed MRI of the brain without contrast on December 04, 2019, right frontal heterogeneous calvarial lesion, expanding the diploic space, mass-effect upon the right inferior frontal lobe, right orbit, right frontal sinus, no abnormal signal within the brain parenchyma  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 Christella Noa, offered thoracic decompression surgery, but patient decided not to proceed with it,  He complains of worsening gait abnormality, left lower extremity weakness, occasionally urinary and bowel incontinence,  He return for electrodiagnostic study today, which showed evidence of active neuropathic changes involving bilateral lower extremity muscles, left worse than right, also chronic neuropathic changes involving left cervical myotomes, C5-7.  REVIEW OF SYSTEMS: Full 14 system review of systems performed and notable only for as above All other review of systems were negative.  ALLERGIES: Allergies  Allergen Reactions  . Penicillin G Swelling  . Penicillins Swelling    Inflammation 69 years old     HOME MEDICATIONS: Current Outpatient Medications  Medication Sig Dispense Refill  . amLODipine (NORVASC) 5 MG tablet Take 5 mg by mouth daily.    Marland Kitchen atorvastatin (LIPITOR) 20 MG tablet Take 20 mg by mouth daily.    Marland Kitchen LUMIGAN 0.01 % SOLN Place 1 drop into both eyes at bedtime.     . Multiple Vitamin (MULTIVITAMIN) tablet Take 1 tablet by mouth daily.    Marland Kitchen omeprazole (PRILOSEC) 20 MG capsule Take 20 mg by mouth daily.    Marland Kitchen OVER THE COUNTER MEDICATION Take 1 tablet by mouth daily. Rhino Max    . OVER THE COUNTER MEDICATION Take 1 tablet by mouth daily. apex muscle    . tamsulosin (FLOMAX) 0.4 MG CAPS capsule Take 0.4 mg by mouth daily.     No current facility-administered medications for this visit.    PAST MEDICAL HISTORY:  Past Medical History:  Diagnosis Date  . Cataract   . Enlarged prostate   . GERD (gastroesophageal reflux disease)   . HLD (hyperlipidemia)   . Hypertension     PAST SURGICAL HISTORY: none FAMILY HISTORY: No family history on  file.  SOCIAL HISTORY: Social History   Socioeconomic History  . Marital status: Legally Separated    Spouse name: Not on file  . Number of children: Not on file  . Years of education: Not on file  . Highest education level: Not on file  Occupational History  . Not on file  Tobacco Use  . Smoking status: Former Smoker    Years: 40.00  . Smokeless tobacco: Never Used  Substance and Sexual Activity  . Alcohol use: Yes  . Drug use: Not Currently  . Sexual activity: Not on file  Other Topics Concern  . Not on file  Social History Narrative  . Not on file   Social Determinants of Health   Financial Resource Strain:   . Difficulty of Paying Living Expenses: Not on file  Food Insecurity:   . Worried About Charity fundraiser in the Last Year: Not on file  . Ran Out of Food in the Last Year: Not on file  Transportation Needs:   . Lack of Transportation (Medical): Not on file  . Lack of Transportation (Non-Medical): Not on file  Physical Activity:   . Days of Exercise per Week: Not on file  . Minutes of Exercise per Session: Not on file  Stress:   . Feeling of Stress : Not on file  Social Connections:   . Frequency of Communication with Friends and Family: Not on file  . Frequency of Social Gatherings with Friends and Family: Not on file  . Attends Religious Services: Not on file  . Active Member of Clubs or Organizations: Not on file  . Attends Archivist Meetings: Not on file  . Marital Status: Not on file  Intimate Partner Violence:   . Fear of Current or Ex-Partner: Not on file  . Emotionally Abused: Not on file  . Physically Abused: Not on file  . Sexually Abused: Not on file     PHYSICAL EXAM   There were no vitals filed for this visit. Not recorded     There is no height or weight on file to calculate BMI.  PHYSICAL EXAMNIATION:  Gen: NAD, conversant, well nourised, well groomed                     Cardiovascular: Regular rate rhythm, no  peripheral edema, warm, nontender. Eyes: Conjunctivae clear without exudates or hemorrhage Neck: Supple, no carotid bruits. Pulmonary: Clear to auscultation bilaterally   NEUROLOGICAL EXAM:  MENTAL STATUS: Speech:    Speech is normal; fluent and spontaneous with normal comprehension.  Cognition:     Orientation to time, place and person     Normal recent and remote memory     Normal Attention span and concentration     Normal Language, naming, repeating,spontaneous speech     Fund of knowledge   CRANIAL NERVES: CN II: Visual fields are full to confrontation. Pupils are round equal and briskly reactive to light. CN III, IV, VI: extraocular movement are normal. No ptosis. CN V: Facial sensation is intact to light touch CN VII: Face is symmetric with normal eye closure  CN VIII: Hearing is normal to causal conversation. CN IX, X: Phonation is normal. CN XI: Head turning  and shoulder shrug are intact  MOTOR: Left upper extremity pronation drift, proximal strengths 4, distal strength 5 -, left hip flexion 3, knee flexion 4, extension 3, left ankle dorsiflexion, plantarflexion 3, right lower extremity strength is normal  REFLEXES: Hyperreflexia on the right upper and lower extremity, mildly increased reflex on left upper and lower extremity, left-sided Babinski signs,  SENSORY: Length dependent decreased light touch, vibratory sensation,  COORDINATION: There is no trunk or limb dysmetria noted.  GAIT/STANCE: He needs to push-up to get up from seated position, dragging left leg   DIAGNOSTIC DATA (LABS, IMAGING, TESTING) - I reviewed patient records, labs, notes, testing and imaging myself where available.   ASSESSMENT AND PLAN  ZIDAN HELGET is a 69 y.o. male   Lifelong history of left side difficulty, progressively worse, dense left lower extremity numbness, increased weakness, and gait abnormality.  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 Ph.D.  [facility] [address] [place], [place] [address] Ph: [phone] Fax: [phone]  CC:  Ranae Plumber, [place] [address] [place],  [place] [address]  Ranae Plumber, [place]

## 2020-04-14 NOTE — Telephone Encounter (Signed)
[employer] medicare order sent to GI. No auth they will reach out to the patient to schedule.  

## 2020-04-14 NOTE — Telephone Encounter (Signed)
Please let patient know, Based on yesterday's study,   we will proceed with MRI of cervical spine

## 2020-04-14 NOTE — Procedures (Signed)
Full Name: Parley Pidcock Gender: Male MRN #: [id number] Date of Birth: [birth year]    Visit Date: 04/13/2020 16:06 Age: 69 Years Examining Physician: Marcial Pacas, MD  Referring Physician: Marcial Pacas, MD History: 69 year old male presented with worsening lower extremity weakness, gait abnormality, also complains of left upper extremity difficulty.  Summary of the test: Nerve conduction study: Left ulnar, superficial peroneal, sural sensory responses were normal.  Left median sensory response showed mildly increased peak latency, with slightly decreased snap amplitude.  Left median, ulnar, peroneal to EDB motor responses were normal.  Left tibial motor responses showed mildly decreased CMAP amplitude  Electromyography: Selected needle examination of bilateral lower extremity muscles, bilateral lumbosacral paraspinal muscles; left cervical muscles and left cervical paraspinal muscles were performed.  There is evidence of chronic neuropathic changes involving bilateral lower extremity muscles left worse than right, involving L4, 5, S1.  There was increased insertional activity, polyphasic motor unit potential with mild decreased recruitment patterns at bilateral lower lumbar paraspinal muscles.  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  There was no spontaneous activity at left cervical paraspinal muscles.   Conclusion: This is an abnormal study.  There is electrodiagnostic evidence of chronic bilateral lumbosacral radiculopathy, involving bilateral L4-5 S1 myotomes, left worse than right.  There is also evidence of chronic left cervical radiculopathy, involving left C5-6-7 myotomes.    ------------------------------- Marcial Pacas, M.D. PhD  [facility] [address], [place] [address] Tel: [phone] Fax: [phone]  Verbal informed consent was obtained from the patient, patient was informed of  potential risk of procedure, including bruising, bleeding, hematoma formation, infection, muscle weakness, muscle pain, numbness, among others.         Arnold    Nerve / Sites Muscle Latency Ref. Amplitude Ref. Rel Amp Segments Distance Velocity Ref. Area    ms ms mV mV %  cm m/s m/s mVms  L Median - APB     Wrist APB 4.3 ?4.4 7.5 ?4.0 100 Wrist - APB 7   24.2     Upper arm APB 8.6  6.4  85.5 Upper arm - Wrist 22 52 ?49 22.7  L Ulnar - ADM     Wrist ADM 2.5 ?3.3 10.4 ?6.0 100 Wrist - ADM 7   33.8     B.Elbow ADM 5.9  9.1  87.6 B.Elbow - Wrist 20 59 ?49 28.6     A.Elbow ADM 7.6  8.9  97.2 A.Elbow - B.Elbow 10 58 ?49 29.1         A.Elbow - Wrist      L Peroneal - EDB     Ankle EDB 4.5 ?6.5 3.7 ?2.0 100 Ankle - EDB 9   13.1     Fib head EDB 11.6  3.2  87.9 Fib head - Ankle 33 47 ?44 12.4     Pop fossa EDB 13.6  3.2  99.8 Pop fossa - Fib head 10 49 ?44 12.2         Pop fossa - Ankle      L Tibial - AH     Ankle AH 3.6 ?5.8 3.0 ?4.0 100 Ankle - AH 9   7.9     Pop fossa AH 12.5  2.4  80.6 Pop fossa - Ankle 39 44 ?41 9.2             SNC    Nerve / Sites Rec. Site Peak Lat  Ref.  Amp Ref. Segments Distance    ms ms V V  cm  L Sural - Ankle (Calf)     Calf Ankle 3.7 ?4.4 16 ?6 Calf - Ankle 14  L Superficial peroneal - Ankle     Lat leg Ankle 3.5 ?4.4 12 ?6 Lat leg - Ankle 14  L Median - Orthodromic (Dig II, Mid palm)     Dig II Wrist 3.8 ?3.4 9 ?10 Dig II - Wrist 13  L Ulnar - Orthodromic, (Dig V, Mid palm)     Dig V Wrist 2.8 ?3.1 7 ?5 Dig V - Wrist 33             F  Wave    Nerve F Lat Ref.   ms ms  L Tibial - AH 51.3 ?56.0  L Ulnar - ADM 29.4 ?32.0         EMG Summary Table    Spontaneous MUAP Recruitment  Muscle IA Fib PSW Fasc Other Amp Dur. Poly Pattern  L. Tibialis anterior Normal None None None _______ Increased Decreased 2+ Reduced  L. Tibialis posterior Normal None None None _______ Increased Increased 3+ Reduced  L. Peroneus longus Normal None None None _______  Increased Increased 2+ Discrete  L. Gastrocnemius (Medial head) Normal None None None _______ Increased Increased 1+ Reduced  L. Vastus lateralis Increased None None None _______ Increased Increased 2+ Reduced  L. Lumbar paraspinals (low) Increased None None None _______ Normal Normal Normal Normal  L. Lumbar paraspinals (mid) Increased None None None _______ Normal Normal Normal Normal  R. Tibialis anterior Normal None None None _______ Increased Increased 1+ Reduced  R. Tibialis posterior Normal None None None _______ Increased Increased 1+ Reduced  R. Peroneus longus Normal None None None _______ Increased Increased 1+ Reduced  R. Gastrocnemius (Medial head) Normal None None None _______ Increased Increased 1+ Reduced  R. Vastus lateralis Normal None None None _______ Increased Increased 1+ Reduced  R. Lumbar paraspinals (low) Normal None None None _______ Normal Normal Normal Normal  R. Lumbar paraspinals (mid) Normal None None None _______ Normal Normal Normal Normal  L. First dorsal interosseous Normal None None None _______ Increased Increased Normal Reduced  L. Pronator teres Normal None None None _______ Increased Increased Normal Reduced  L. Biceps brachii Normal None None None _______ Increased Increased 1+ Reduced  L. Deltoid Normal None None None _______ Increased Increased Normal Reduced  L. Triceps brachii Normal None None None _______ Increased Increased Normal Reduced  L. Cervical paraspinals Normal None None None _______ Normal Increased 1+ Reduced

## 2020-04-14 NOTE — Telephone Encounter (Signed)
I spoke to the patient and he is agreeable to proceed with the cervical MRI. He is aware to expect a call for scheduling.

## 2020-04-14 NOTE — Telephone Encounter (Signed)
The patient called back. He would like to complete his MRI in [place] since that is closer to his home.

## 2020-04-23 ENCOUNTER — Ambulatory Visit
Admission: RE | Admit: 2020-04-23 | Discharge: 2020-04-23 | Disposition: A | Discharge status: Home / Self Care | Payer: Medicare Other | Source: Ambulatory Visit | Attending: Neurology | Admitting: Neurology

## 2020-04-23 ENCOUNTER — Telehealth: Payer: Self-pay | Admitting: Neurology

## 2020-04-23 ENCOUNTER — Other Ambulatory Visit: Payer: Self-pay

## 2020-04-23 DIAGNOSIS — M8500 Fibrous dysplasia (monostotic), unspecified site: Secondary | ICD-10-CM

## 2020-04-23 DIAGNOSIS — R29898 Other symptoms and signs involving the musculoskeletal system: Secondary | ICD-10-CM | POA: Diagnosis present

## 2020-04-23 DIAGNOSIS — M4804 Spinal stenosis, thoracic region: Secondary | ICD-10-CM

## 2020-04-23 IMAGING — MR MR CERVICAL SPINE W/O CM
5 series · 35 of 48 positions shown · non-contrast
Comparison: No pertinent prior exams available for comparison.

CLINICAL DATA: Thoracic stenosis. Fibrous dysplasia unspecified
site. Left arm weakness. Cervical radiculopathy, no red flags.
Additional history provided by scanning technologist: Dense left
lower extremity numbness, increased weakness.

EXAM:
MRI CERVICAL SPINE WITHOUT CONTRAST
TECHNIQUE: Multiplanar, multisequence MR imaging of the cervical spine was
performed. No intravenous contrast was administered.

[Series 9: T2 · sagittal · 3.0mm · 0.62mm/px · 6 of 15 slices shown (1 of 2)]
[im 1/15]
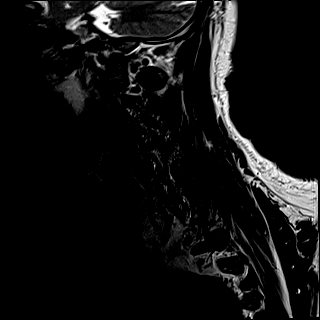
[im 3/15]
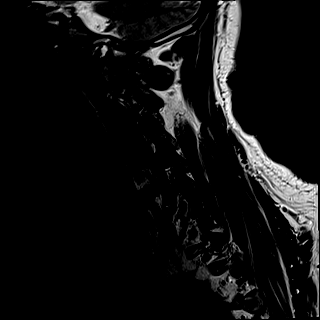
[im 6/15]
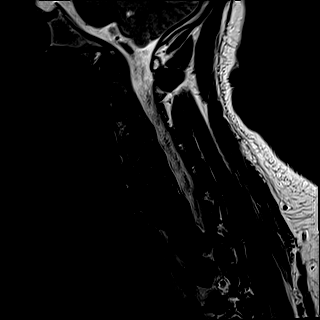
[im 9/15]
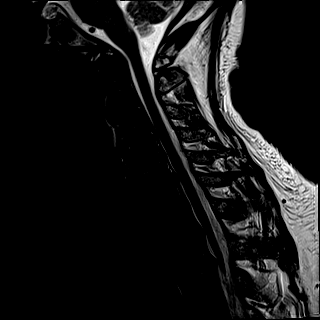
[im 12/15]
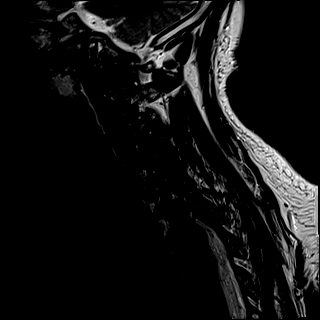
[im 15/15]
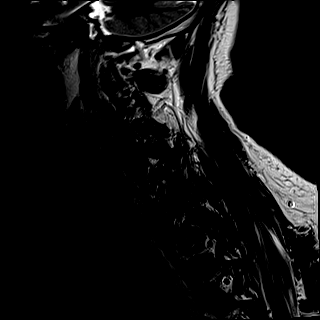

[Series 10: FLAIR · sagittal · 3.0mm · 0.78mm/px · 7 of 15 slices shown]
[im 1/15]
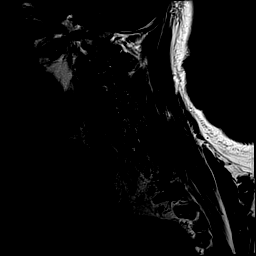
[im 3/15]
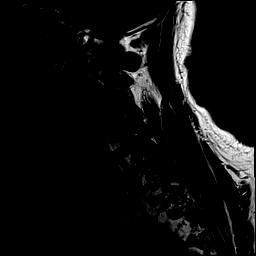
[im 5/15]
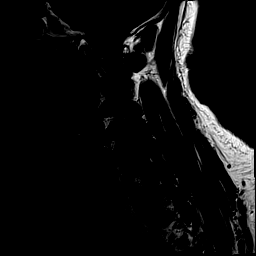
[im 8/15]
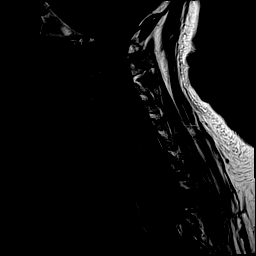
[im 10/15]
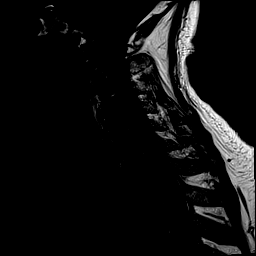
[im 12/15]
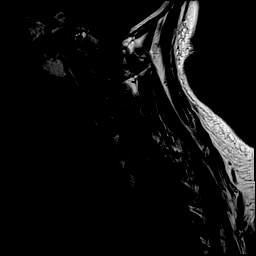
[im 15/15]
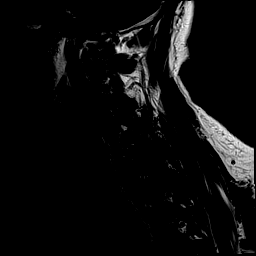

[Series 11: STIR · sagittal · 3.0mm · 0.62mm/px · 7 of 15 slices shown]
[im 1/15]
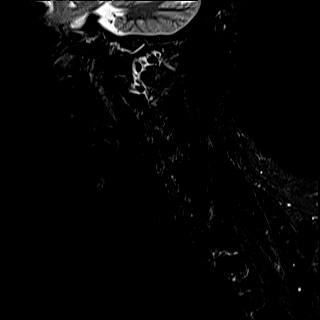
[im 3/15]
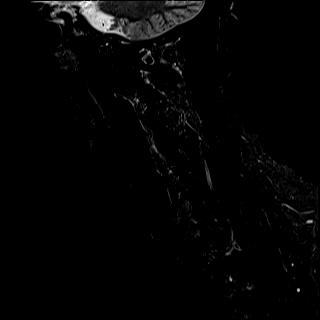
[im 5/15]
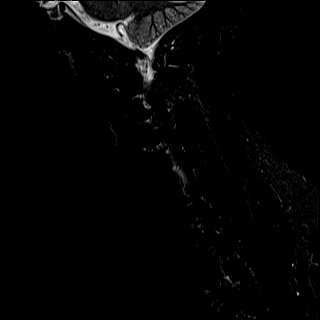
[im 8/15]
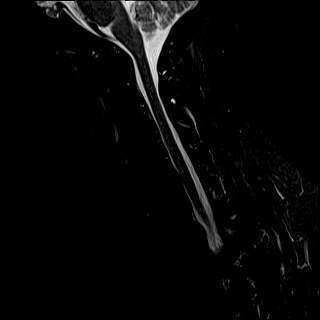
[im 10/15]
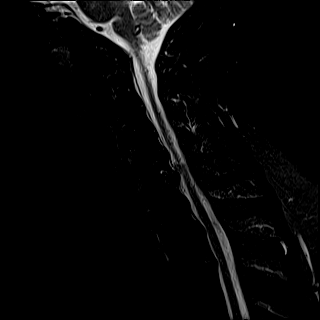
[im 12/15]
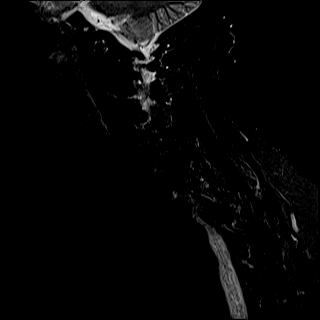
[im 15/15]
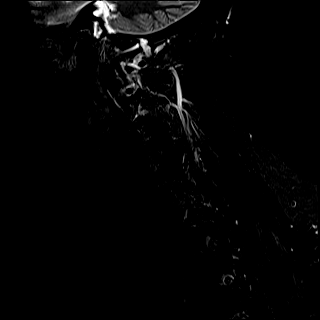

[Series 12: T2 · axial · 3.0mm · 0.70mm/px · z∈[-280,-178]mm · 8 of 31 slices shown (2 of 2)]
[im 1/31]
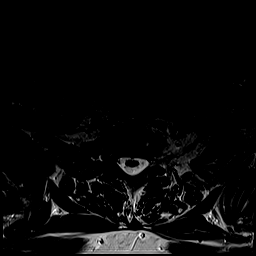
[im 5/31]
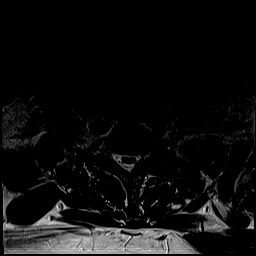
[im 10/31]
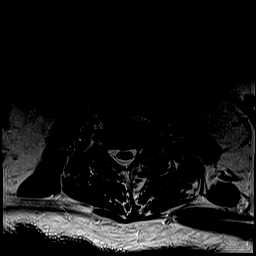
[im 14/31]
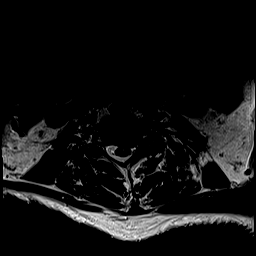
[im 17/31]
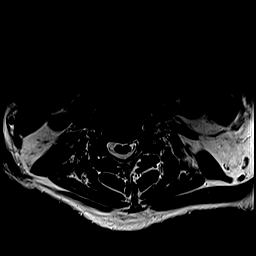
[im 21/31]
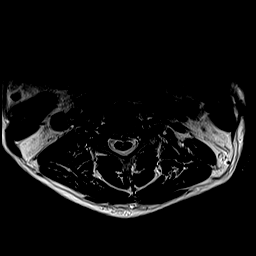
[im 26/31]
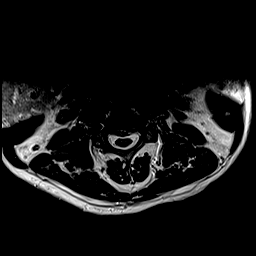
[im 31/31]
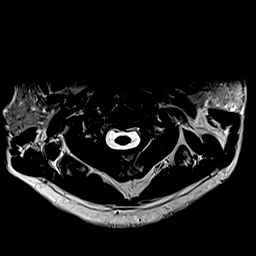

[Series 13: ax mpgr · axial · 3.0mm · 0.35mm/px · z∈[-280,-195]mm · 7 of 31 slices shown]
[im 1/31]
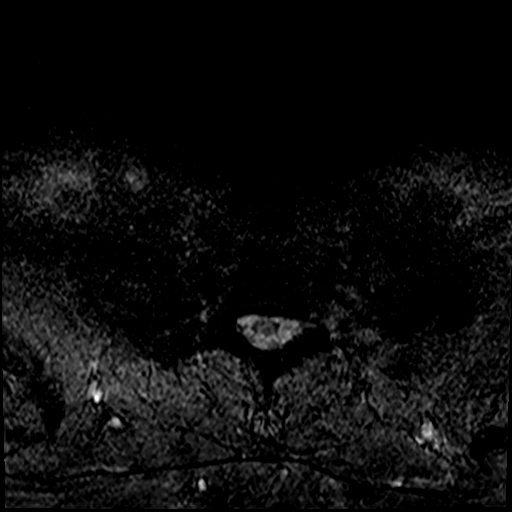
[im 5/31]
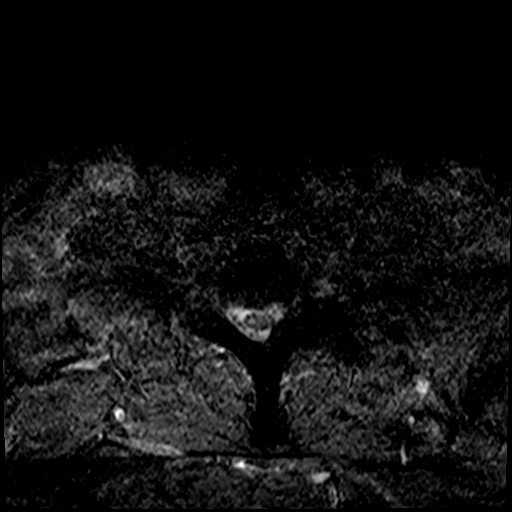
[im 10/31]
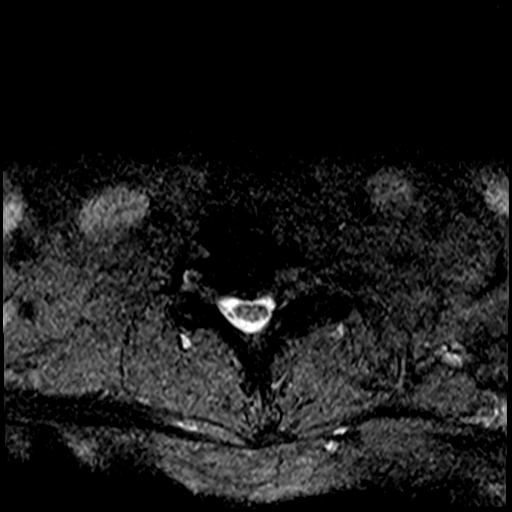
[im 14/31]
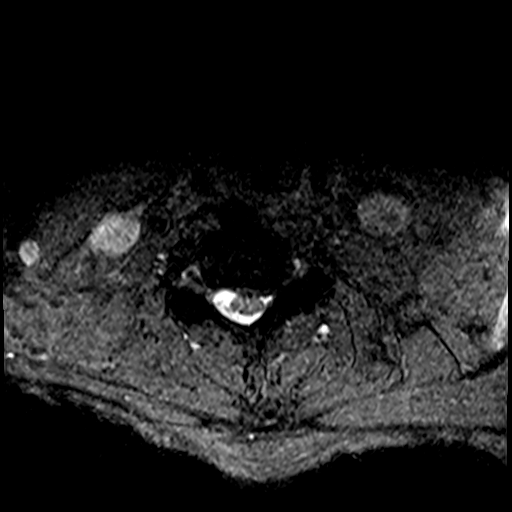
[im 17/31]
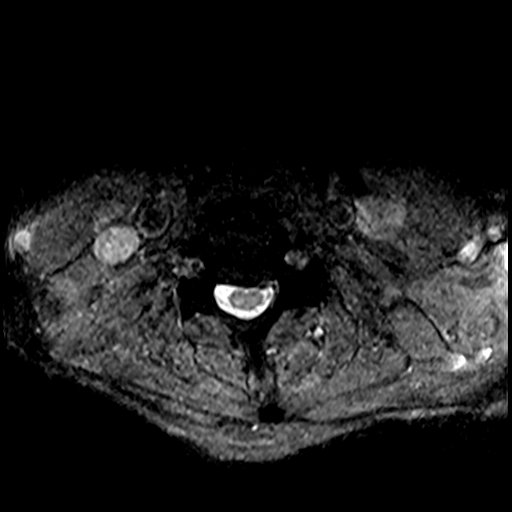
[im 21/31]
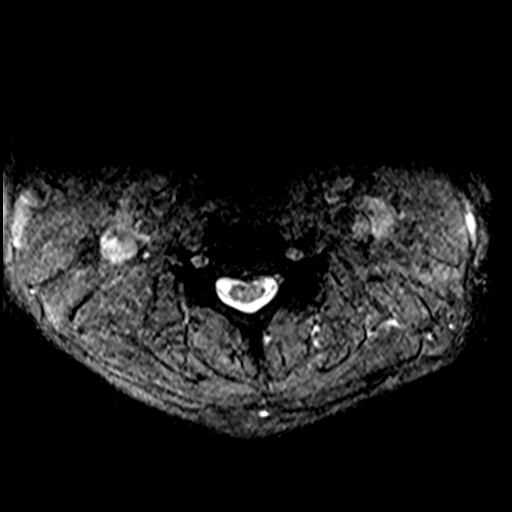
[im 26/31]
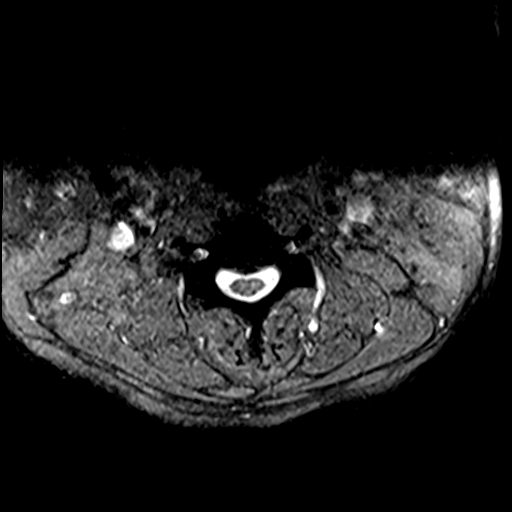

[35 of 48 positions shown; findings below may reference images not displayed]

FINDINGS: Alignment: Subtle reversal of the expected cervical lordosis. 2 mm
C7-T1 grade 1 anterolisthesis.

Vertebrae: Vertebral body height is maintained. Moderate-sized T1
inferior endplate Schmorl node. Otherwise mild multilevel
degenerative endplate irregularity. Multilevel fatty degenerative
endplate marrow signal greatest at T1-T2. No significant marrow
edema or focal suspicious osseous lesion.

Cord: Possible subtle T2/FLAIR hyperintense signal abnormality
within the spinal cord at the T1-T2 level as described below.

Posterior Fossa, vertebral arteries, paraspinal tissues: No
abnormality identified within included portions of the posterior
fossa. Flow voids preserved within the imaged cervical vertebral
arteries. Paraspinal soft tissues within normal limits.

Disc levels:

Mild/moderate disc degeneration at C5-C6. No more than mild disc
degeneration at the remaining cervical levels. Moderate T1-T2 disc
degeneration.

C2-C3: No significant disc herniation or spinal canal stenosis.
Facet hypertrophy with resultant mild right neural foraminal
narrowing.

C3-C4: No significant disc herniation or spinal canal stenosis.
Uncovertebral and facet hypertrophy. Bilateral neural foraminal
narrowing (mild/moderate right, mild left).

C4-C5: Left-sided disc osteophyte ridge/uncinate hypertrophy. Facet
hypertrophy (greater on the left). No significant spinal canal
stenosis. Severe left neural foraminal narrowing.

C5-C6: Left center broad-based disc protrusion. Associated
left-sided disc osteophyte ridge/uncinate hypertrophy. Facet
hypertrophy (greater on the left). Mild spinal canal narrowing with
contact upon the ventral spinal cord. Severe left neural foraminal
narrowing.

C6-C7: Shallow disc bulge. Facet hypertrophy. No significant spinal
canal stenosis. Mild right neural foraminal narrowing.

C7-T1: Grade 1 anterolisthesis. Disc uncovering. Facet
arthrosis/ligamentum flavum hypertrophy (greater on the left). No
significant spinal canal stenosis or neural foraminal narrowing.

T1-T2: This level is imaged sagittally. Disc bulge with superimposed
small central disc protrusion. The disc protrusion contacts and
mildly flattens the ventral spinal cord. Questionable subtle T2
hyperintense signal within the spinal cord at this level (series 9,
image 10). Overall mild spinal canal stenosis. No significant neural
foraminal narrowing.

Impression #2 will be called to the ordering clinician or
representative by the Radiologist Assistant, and communication
documented in the PACS or [REDACTED].
IMPRESSION: Cervical spondylosis as outlined with no more than mild spinal canal
stenosis. Most notably at C5-C6, a broad-based left center disc
protrusion contacts the ventral spinal cord. Multilevel neural
foraminal narrowing greatest on the left at C4-C5 and C5-C6 (severe
at these sites).

The T1-T2 level is imaged in the sagittal plane only. At this level,
a small central disc protrusion contacts and mildly flattens the
ventral spinal cord. Questionable subtle T2 hyperintense signal
abnormality within the spinal cord at this site, which may reflect
myelomalacia or focal edema. A dedicated thoracic spine MRI may be
helpful for further evaluation.

## 2020-04-23 NOTE — Telephone Encounter (Signed)
  IMPRESSION: Cervical spondylosis as outlined with no more than mild spinal canal stenosis. Most notably at C5-C6, a broad-based left center disc protrusion contacts the ventral spinal cord. Multilevel neural foraminal narrowing greatest on the left at C4-C5 and C5-C6 (severe at these sites).  The T1-T2 level is imaged in the sagittal plane only. At this level, a small central disc protrusion contacts and mildly flattens the ventral spinal cord. Questionable subtle T2 hyperintense signal abnormality within the spinal cord at this site, which may reflect myelomalacia or focal edema. A dedicated thoracic spine MRI may be helpful for further evaluation.  Please call patient, MRI of cervical spine showed multilevel degenerative changes, most noticeable at C5-6, with no evidence of canal stenosis, mild foraminal stenosis,

## 2020-04-26 ENCOUNTER — Telehealth: Payer: Self-pay | Admitting: Neurology

## 2020-04-26 NOTE — Telephone Encounter (Signed)
Dr. Krista Blue has already reviewed this patient's cervical MRI scan and he has been notified. [facility] said they will close the call report case.

## 2020-04-26 NOTE — Telephone Encounter (Signed)
Opal Sidles from [facility] called for a call report. She asks even if we may already have the results, to please contact office & anyone can give the call report. She asks that they be called back asap.

## 2020-04-26 NOTE — Telephone Encounter (Signed)
Luis Clark from [facility] called for a call report. She asks even if we may already have the results, to please contact office & anyone can give the call report. She asks that they be called back asap.

## 2020-04-26 NOTE — Telephone Encounter (Signed)
I spoke to the patient and provided him with her MRI cervical results below. He verbalized understanding.

## 2020-05-14 ENCOUNTER — Telehealth: Payer: Self-pay | Admitting: Neurology

## 2020-05-14 NOTE — Telephone Encounter (Signed)
Pt states his pain is worsening and while he waits to be seen he would like to know if something could be suggested or called in for him to take.

## 2020-05-14 NOTE — Telephone Encounter (Signed)
I called the patient back. He has a pending appt w/ Sarah on 06/07/20. I offered an earlier appt on 05/18/20. He has a court date in [place] that day and unable to come . He has not tried any medication at home. His neuropathic/back pain is worsening. He has not had scheduled surgery yet in hopes that his symptoms would improve. He would like to try Aleve at home and will call us back next week, if he wishes to still be seen sooner.

## 2020-06-07 ENCOUNTER — Encounter: Payer: Self-pay | Admitting: Neurology

## 2020-06-07 ENCOUNTER — Ambulatory Visit (INDEPENDENT_AMBULATORY_CARE_PROVIDER_SITE_OTHER): Payer: Medicare Other | Admitting: Neurology

## 2020-06-07 VITALS — BP 142/80 | HR 82 | Ht 67.0 in | Wt 250.0 lb

## 2020-06-07 DIAGNOSIS — M4804 Spinal stenosis, thoracic region: Secondary | ICD-10-CM

## 2020-06-07 DIAGNOSIS — R2689 Other abnormalities of gait and mobility: Secondary | ICD-10-CM

## 2020-06-07 NOTE — Patient Instructions (Signed)
Please get back in to see Dr. Christella Noa for evaluation of symptoms and plan

## 2020-06-07 NOTE — Progress Notes (Signed)
HISTORICAL  Luis LundMarvin H Clark, is a 70 years old male seen in request by his orthopedic surgeon Dr. Darrelyn HillockGioffre for evaluation of left leg weakness, numbness, initial evaluation is on November 17, 2019, he is accompanied by his significant others at today's visit.  I reviewed and summarized the referring note.  Past medical history of Hypertension, taking Norvasc 5 mg daily Hyperlipidemia, Lipitor 20 mg daily Acid reflux, omeprazole 20 mg daily  He is a poor historian, reported lifelong history of left side difficulty, he was enlisted in the [employer] [phone] [id number], was told he his left leg is shorter, he had no trouble walking, but he did reported with prolonged running, he would notice difficulty, his friend who has known him for 20 years, noted that he has a limp gait for many years.  His left side difficulty gradually getting worse, especially since last 5 years, he noticed increased left leg numbness weakness, numbness involving whole left leg from hip, left leg weakness, difficulty picking up against gravity.,  He also noticed worsening left arm weakness, numbness, he denies speech difficulty, no dysphagia, no bowel and bladder incontinence,  Personally reviewed [facility] December [facility], 2019, multilevel lumbar degenerative changes 311 2 L1 partial demonstration of bilateral posterolateral and the fracture foraminal disc protrusion with squatting effacement probable significant foraminal stenosis bilaterally, L4-5 moderate central and left foraminal stenosis, variable degree of foraminal stenosis at the other levels  UPDATE Apr 13 2020: I personally reviewed MRI of the brain without contrast on December 04, 2019, right frontal heterogeneous calvarial lesion, expanding the diploic space, mass-effect upon the right inferior frontal lobe, right orbit, right frontal sinus, no abnormal signal within the brain parenchyma  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  Here today accompanied by his friend, [name].  Previously saw Dr. Franky Machoabbell, neurosurgery, they tell me, can do surgery if it gets bad enough, [facility] Franky Machoabbell getting privileges, didn't hear anything further.  Here today, continued left lower extremity weakness, worsening gait abnormality, frequent falls, urinary incontinence, has constipation.  Using a cane.  When he walks, feels throbbing from bottom of feet up the legs, been there since April.  REVIEW OF SYSTEMS: Full 14 system review of systems performed and notable only for as above  See HPI  ALLERGIES: Allergies  Allergen Reactions  . Penicillin G Swelling  . Penicillins Swelling    Inflammation 70 years old     HOME MEDICATIONS: Current Outpatient Medications  Medication Sig Dispense Refill  . amLODipine (NORVASC) 5 MG tablet Take 5 mg by mouth daily.    Marland Kitchen. atorvastatin (LIPITOR) 20 MG tablet Take 20 mg by mouth daily.    Marland Kitchen. LUMIGAN 0.01 %  SOLN Place 1 drop into both eyes at bedtime.     . Multiple Vitamin (MULTIVITAMIN) tablet Take 1 tablet by  mouth daily.    Marland Kitchen omeprazole (PRILOSEC) 20 MG capsule Take 20 mg by mouth daily.    Marland Kitchen OVER THE COUNTER MEDICATION Take 1 tablet by mouth daily. apex muscle    . tamsulosin (FLOMAX) 0.4 MG CAPS capsule Take 0.4 mg by mouth daily.     No current facility-administered medications for this visit.    PAST MEDICAL HISTORY: Past Medical History:  Diagnosis Date  . Cataract   . Enlarged prostate   . GERD (gastroesophageal reflux disease)   . HLD (hyperlipidemia)   . Hypertension     PAST SURGICAL HISTORY: none FAMILY HISTORY: No family history on file.  SOCIAL HISTORY: Social History   Socioeconomic History  . Marital status: Legally Separated    Spouse name: Not on file  . Number of children: Not on file  . Years of education: Not on file  . Highest education level: Not on file  Occupational History  . Not on file  Tobacco Use  . Smoking status: Former Smoker    Years: 40.00  . Smokeless tobacco: Never Used  Substance and Sexual Activity  . Alcohol use: Yes  . Drug use: Not Currently  . Sexual activity: Not on file  Other Topics Concern  . Not on file  Social History Narrative  . Not on file   Social Determinants of Health   Financial Resource Strain: Not on file  Food Insecurity: Not on file  Transportation Needs: Not on file  Physical Activity: Not on file  Stress: Not on file  Social Connections: Not on file  Intimate Partner Violence: Not on file   PHYSICAL EXAM   Vitals:   06/07/20 1312  BP: (!) 142/80  Pulse: 82  Weight: 250 lb (113.4 kg)  Height: 5\' 7"  (1.702 m)   Not recorded     Body mass index is 39.16 kg/m.  PHYSICAL EXAMNIATION:  Gen: NAD, conversant, well nourised, well groomed                     NEUROLOGICAL EXAM:  MENTAL STATUS: Speech:    Speech is normal; fluent and spontaneous with normal comprehension.  Cognition:     Orientation to time, place and person     Normal recent and remote memory     Normal Attention span and  concentration     Normal Language, naming, repeating,spontaneous speech     Fund of knowledge   CRANIAL NERVES: CN II: Visual fields are full to confrontation. Pupils are round equal and briskly reactive to light. CN III, IV, VI: extraocular movement are normal. No ptosis. CN V: Facial sensation is intact to light touch CN VII: Face is symmetric with normal eye closure  CN VIII: Hearing is normal to causal conversation. CN IX, X: Phonation is normal. CN XI: Head turning and shoulder shrug are intact  MOTOR: Left upper extremity pronation drift, proximal strengths 4, distal strength 5 -, left hip flexion 3, knee flexion 4, extension 3, left ankle dorsiflexion, plantarflexion 2, right lower extremity strength is normal  REFLEXES: mildly increased reflex on left upper and lower extremity  SENSORY: Decreased sensation to soft touch to the left lower extremity  COORDINATION: Difficulty lifting the left leg for heel-to-shin  GAIT/STANCE: He needs to push-up to get up from seated position, dragging left leg   DIAGNOSTIC DATA (LABS, IMAGING,  TESTING) - I reviewed patient records, labs, notes, testing and imaging myself where available.   ASSESSMENT AND PLAN  DAVEION ROBAR is a 70 y.o. male   1. Lifelong history of left side difficulty, progressively worse, dense left lower extremity numbness, increased weakness, and gait abnormality.  2. Right frontal fibrosis dysplasia -CT head August 2021 ordered by Dr. Christella Noa, right frontal skull lesion favored to reflect fibrous dysplasia  3. 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 Christella Noa in 1 to 2 weeks, encouraged to follow-up, discuss surgical options; for the pain, has been present for nearly 1 year, discussed options, gabapentin, prefers to wait until he sees Dr. Christella Noa -Follow-up here in 4 to 5 months or sooner if needed  I spent 30 minutes of face-to-face and non-face-to-face time with patient.  This included previsit chart review, lab review, study review, order entry, electronic health record documentation, patient education.  Evangeline Dakin, DNP  [facility] [address] [place], [place] [address] [phone]

## 2020-06-23 ENCOUNTER — Ambulatory Visit: Payer: Medicare Other | Admitting: Neurology

## 2020-06-25 ENCOUNTER — Other Ambulatory Visit: Payer: Self-pay | Admitting: Neurosurgery

## 2020-06-25 DIAGNOSIS — M899 Disorder of bone, unspecified: Secondary | ICD-10-CM

## 2020-07-14 ENCOUNTER — Ambulatory Visit
Admission: RE | Admit: 2020-07-14 | Discharge: 2020-07-14 | Disposition: A | Discharge status: Home / Self Care | Payer: Medicare Other | Source: Ambulatory Visit | Attending: Neurosurgery | Admitting: Neurosurgery

## 2020-07-14 ENCOUNTER — Other Ambulatory Visit: Payer: Self-pay

## 2020-07-14 DIAGNOSIS — D164 Benign neoplasm of bones of skull and face: Secondary | ICD-10-CM | POA: Insufficient documentation

## 2020-07-14 DIAGNOSIS — M899 Disorder of bone, unspecified: Secondary | ICD-10-CM

## 2020-07-14 IMAGING — CT CT HEAD W/O CM
3 series · 15 of 46 positions shown, 18 images · non-contrast
Comparison: [DATE]

CLINICAL DATA: Follow-up calvarial lesion

EXAM:
CT HEAD WITHOUT CONTRAST
TECHNIQUE: Contiguous axial images were obtained from the base of the skull
through the vertex without intravenous contrast.

[Series 3: head wo · axial · 0.46mm/px · z∈[+325,+445]mm · 9 of 29 slices shown, 12 images]
[im 3/29  brain]
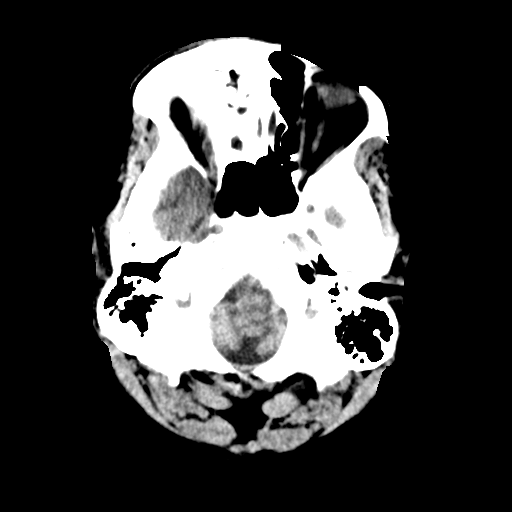
[im 3/29  bone]
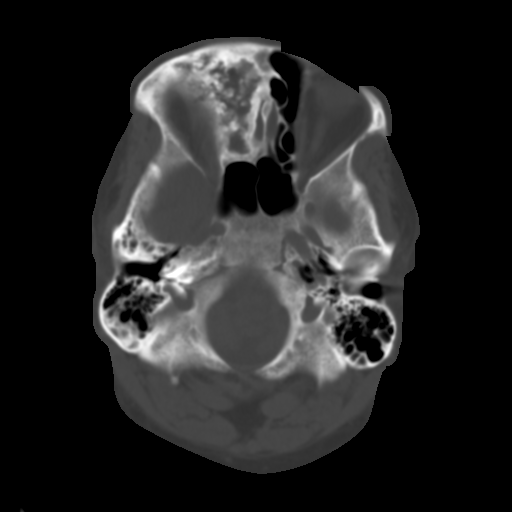
[im 6/29  brain]
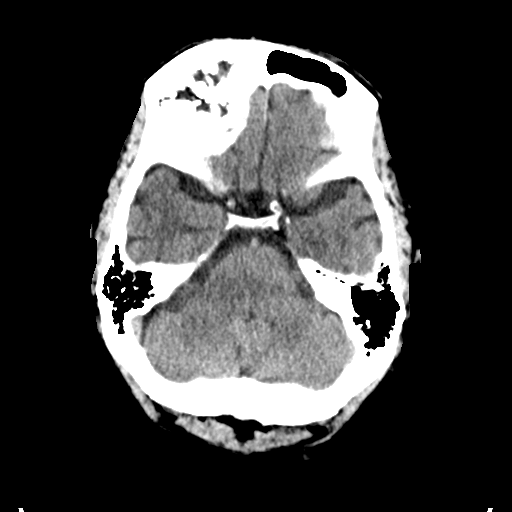
[im 9/29  brain]
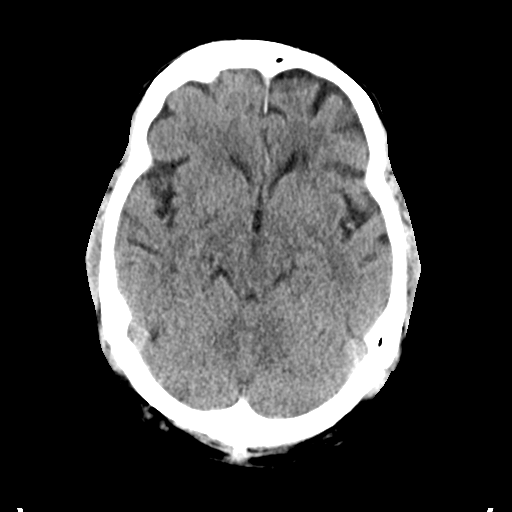
[im 12/29  brain]
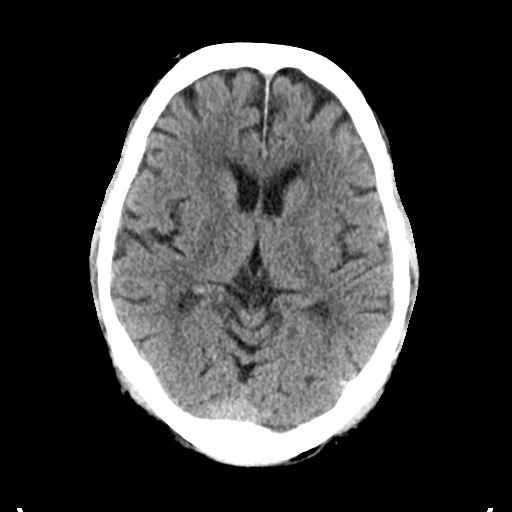
[im 15/29  brain]
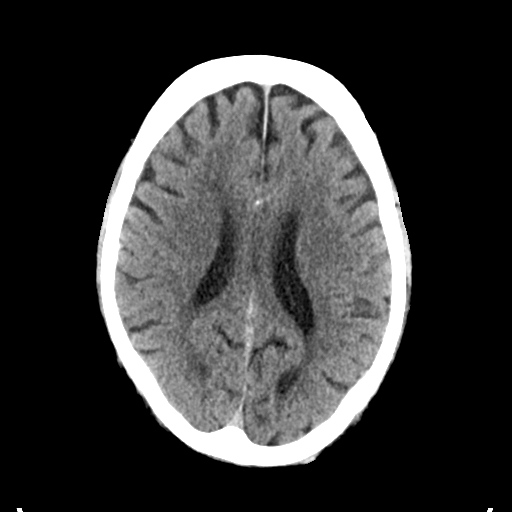
[im 15/29  bone]
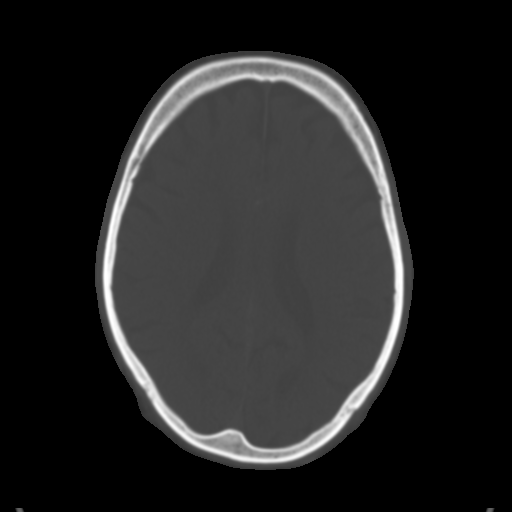
[im 18/29  brain]
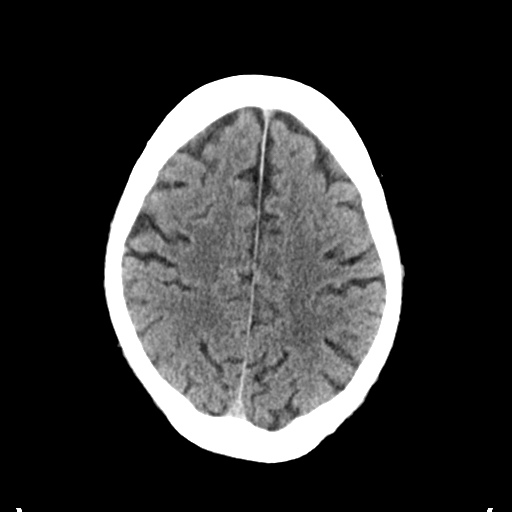
[im 21/29  brain]
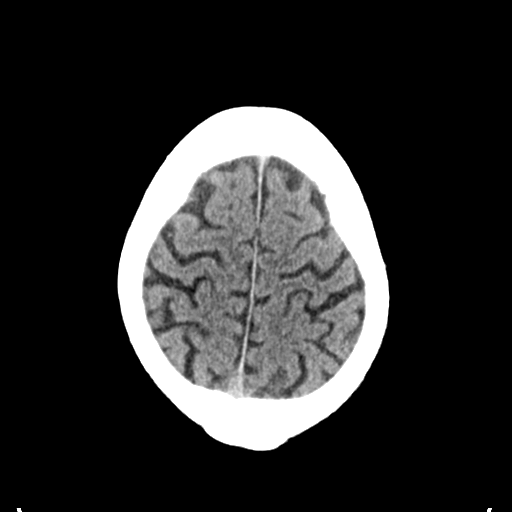
[im 24/29  brain]
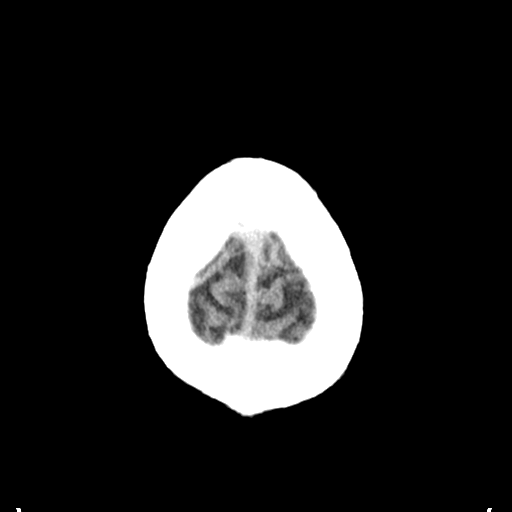
[im 27/29  brain]
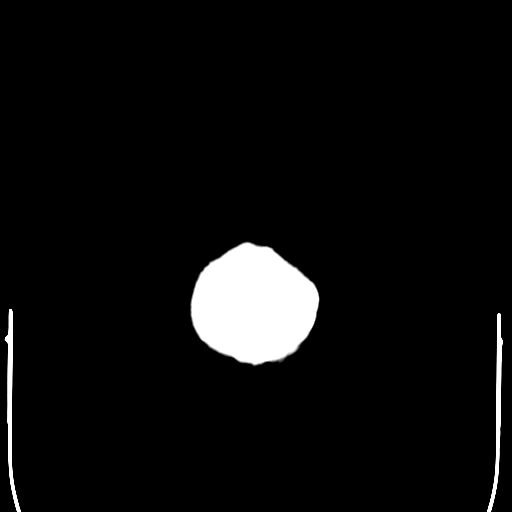
[im 27/29  bone]
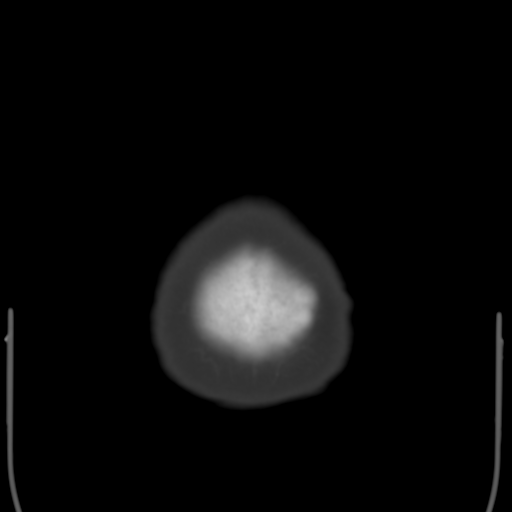

[Series 4: coronal soft tissue · coronal · 0.28mm/px · 3 of 70 slices shown]
[im 24/70  brain]
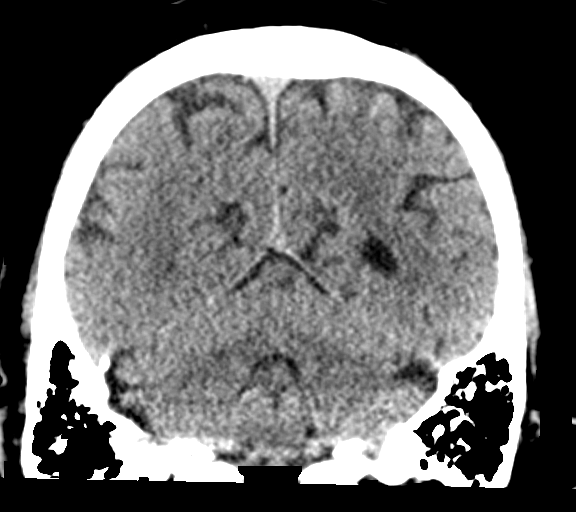
[im 31/70  brain]
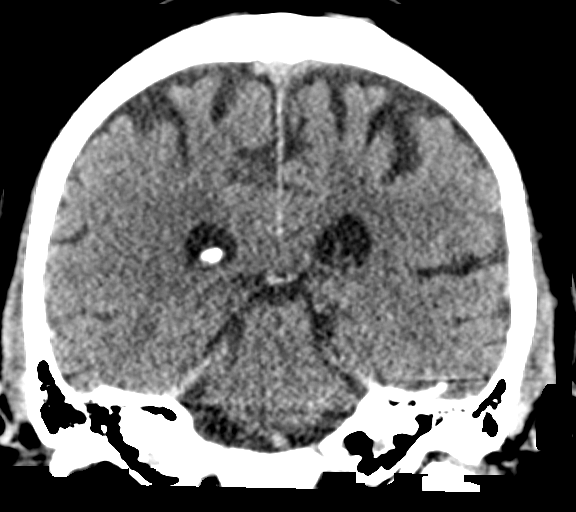
[im 39/70  brain]
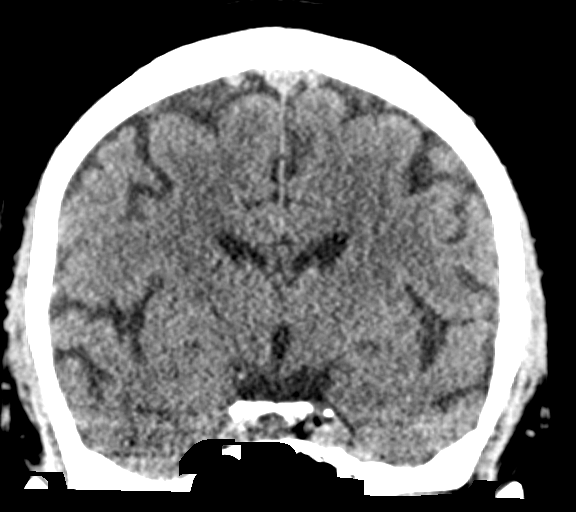

[Series 5: sagittal soft tissue · sagittal · 0.28mm/px · 3 of 55 slices shown]
[im 19/55  brain]
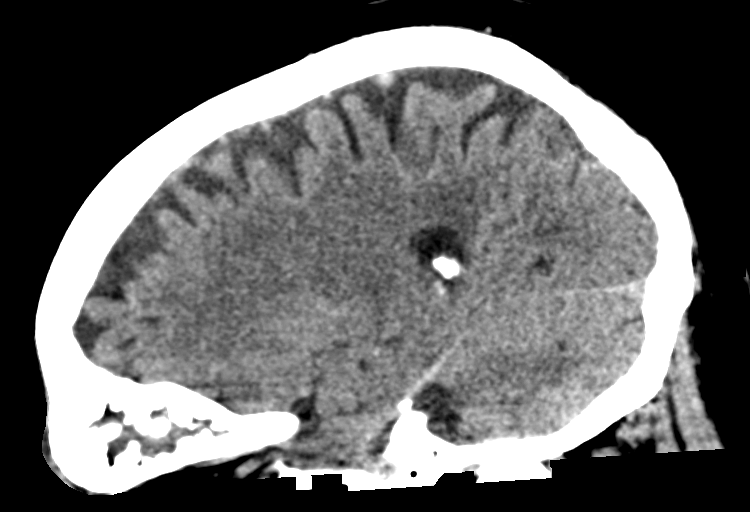
[im 28/55  brain]
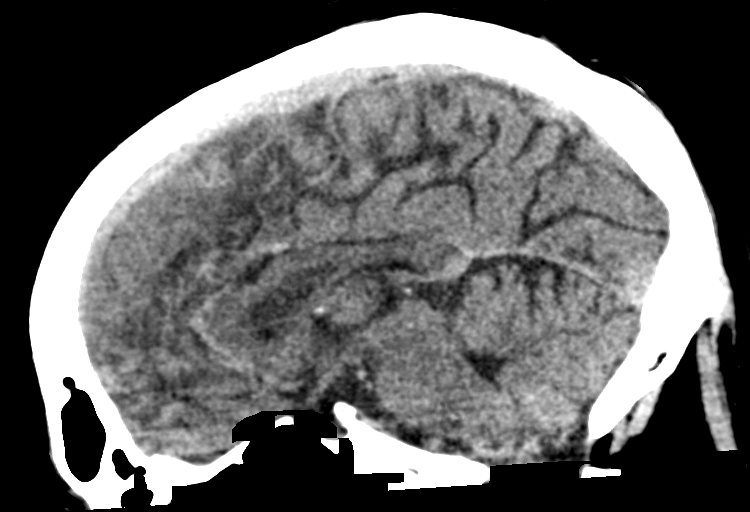
[im 37/55  brain]
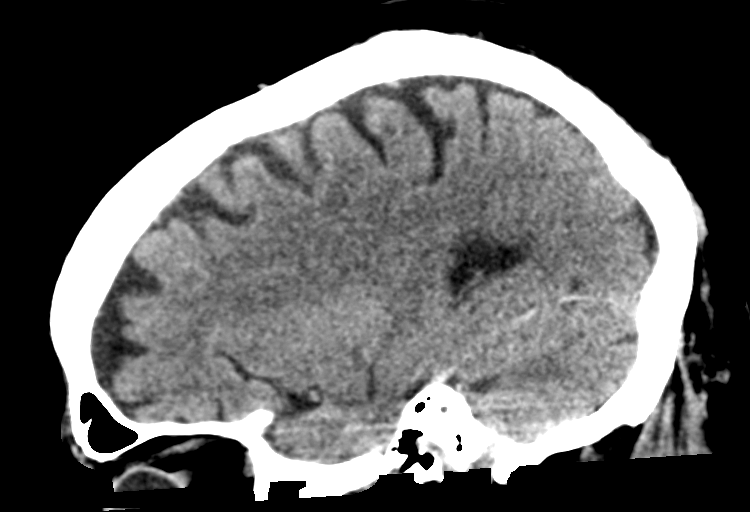

[15 of 46 positions shown; findings below may reference images not displayed]

FINDINGS: Brain: There is no acute intracranial hemorrhage, mass effect, or
edema. Gray-white differentiation is preserved. There is no
extra-axial fluid collection. Ventricles and sulci stable in size
and configuration. Patchy hypoattenuation in the supratentorial
white matter likely reflects stable chronic microvascular ischemic
changes.

Vascular: No hyperdense vessel or unexpected calcification.There is
atherosclerotic calcification at the skull base.

Skull: Mildly expansile, heterogeneous lesion of the right frontal
calvarium with involvement of the orbital roof and expected location
of the frontal sinus. Appearance is unchanged and there are no
aggressive features.

Sinuses/Orbits: As above.  No significant opacification.

Other: Mastoid air cells are clear.
IMPRESSION: No substantial change in right frontal calvarial lesion again
favored to reflect fibrous dysplasia.

## 2020-07-27 ENCOUNTER — Other Ambulatory Visit: Payer: Self-pay | Admitting: Neurosurgery

## 2020-07-27 DIAGNOSIS — M4804 Spinal stenosis, thoracic region: Secondary | ICD-10-CM

## 2020-08-10 ENCOUNTER — Encounter: Payer: Self-pay | Admitting: Neurology

## 2020-08-18 ENCOUNTER — Ambulatory Visit
Admission: RE | Admit: 2020-08-18 | Discharge: 2020-08-18 | Disposition: A | Discharge status: Home / Self Care | Payer: Medicare Other | Source: Ambulatory Visit | Attending: Neurosurgery | Admitting: Neurosurgery

## 2020-08-18 DIAGNOSIS — M4804 Spinal stenosis, thoracic region: Secondary | ICD-10-CM

## 2020-08-18 IMAGING — MR MR THORACIC SPINE W/O CM
5 of 6 series · 28 of 48 positions shown · non-contrast
Comparison: MRI thoracic spine [DATE]

CLINICAL DATA: Numbness in feet and legs 8 months.

EXAM:
MRI THORACIC SPINE WITHOUT CONTRAST
TECHNIQUE: Multiplanar, multisequence MR imaging of the thoracic spine was
performed. No intravenous contrast was administered.

[Series 16: t1_tse_sag count · sagittal · 3.0mm · 1.06mm/px · 2 of 19 slices shown]
[im 1/19]
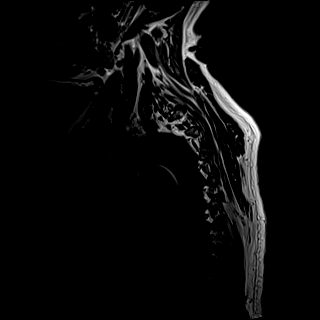
[im 5/19]
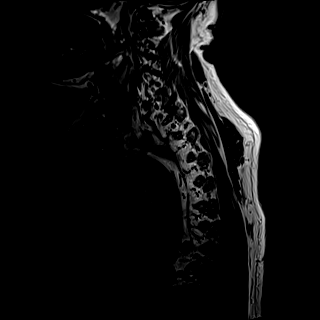

[Series 17: T1 · sagittal · 3.0mm · 0.91mm/px · 6 of 20 slices shown]
[im 1/20]
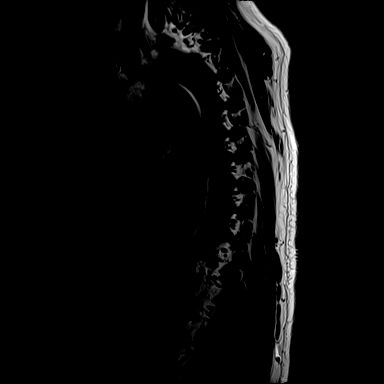
[im 4/20]
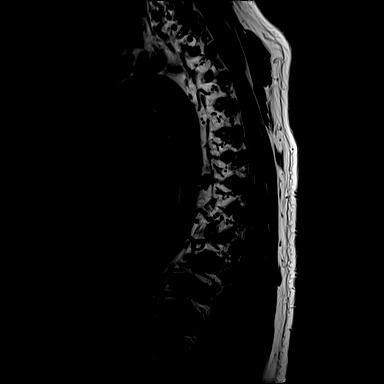
[im 8/20]
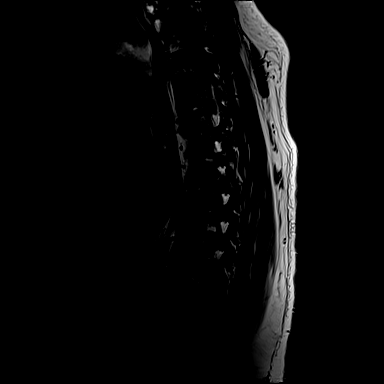
[im 12/20]
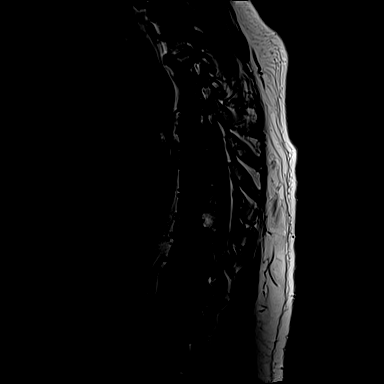
[im 16/20]
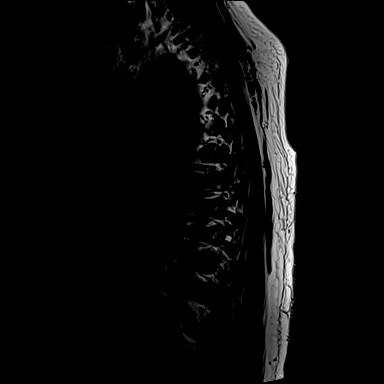
[im 20/20]
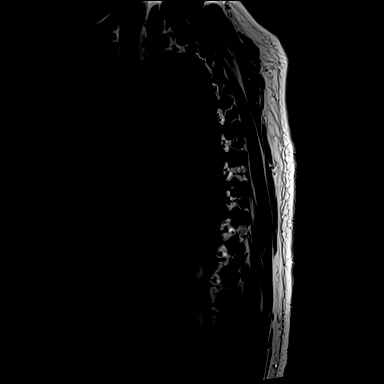

[Series 18: STIR · sagittal · 3.0mm · 1.00mm/px · 5 of 19 slices shown]
[im 1/19]
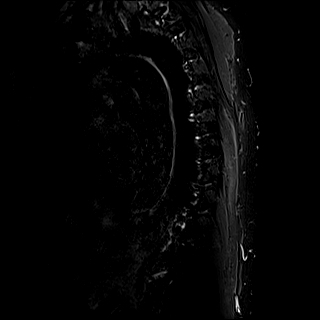
[im 5/19]
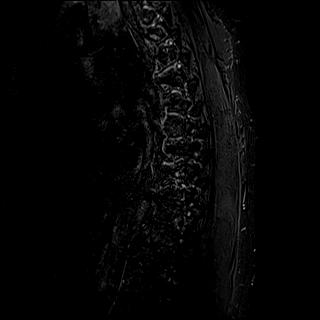
[im 10/19]
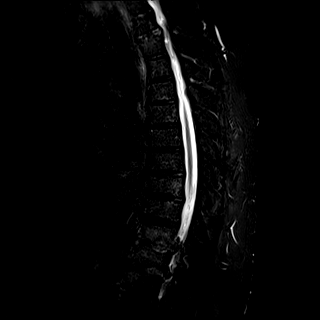
[im 14/19]
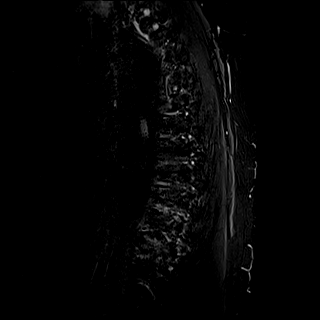
[im 19/19]
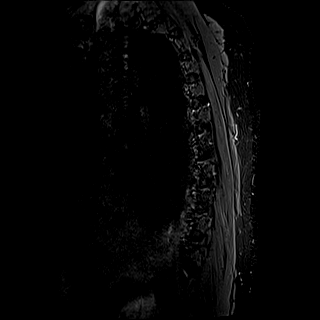

[Series 19: T2 · sagittal · 3.0mm · 0.83mm/px · 6 of 20 slices shown (1 of 2)]
[im 1/20]
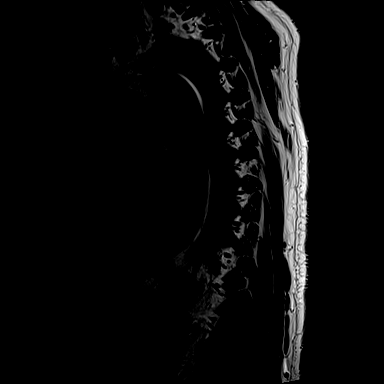
[im 4/20]
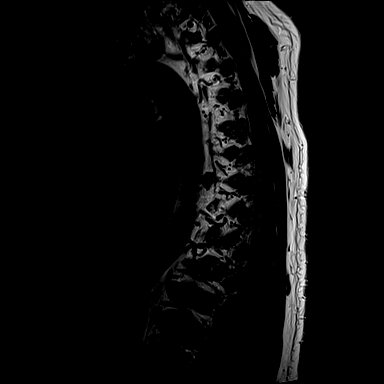
[im 8/20]
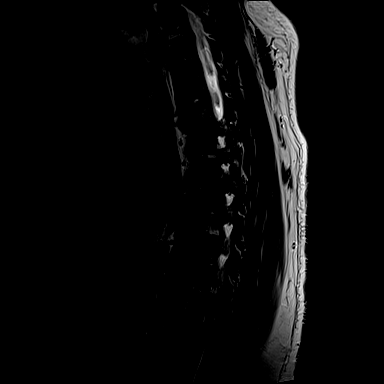
[im 12/20]
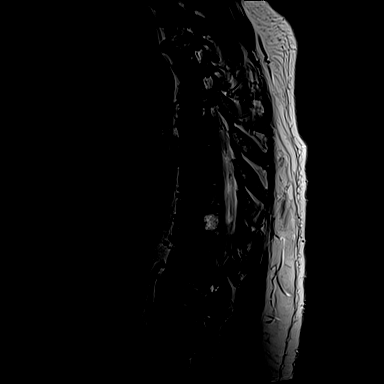
[im 16/20]
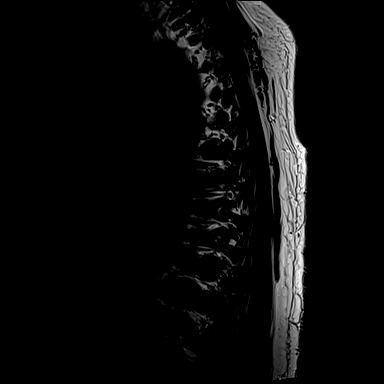
[im 20/20]
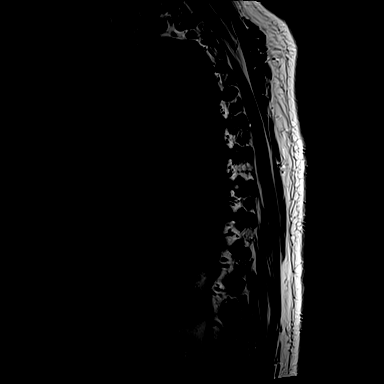

[Series 20: T2 · axial · 4.0mm · 0.35mm/px · z∈[-267,-26]mm · 9 of 46 slices shown (2 of 2)]
[im 1/46]
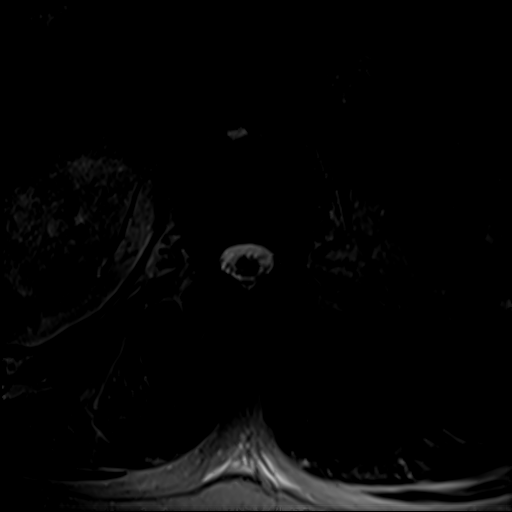
[im 8/46]
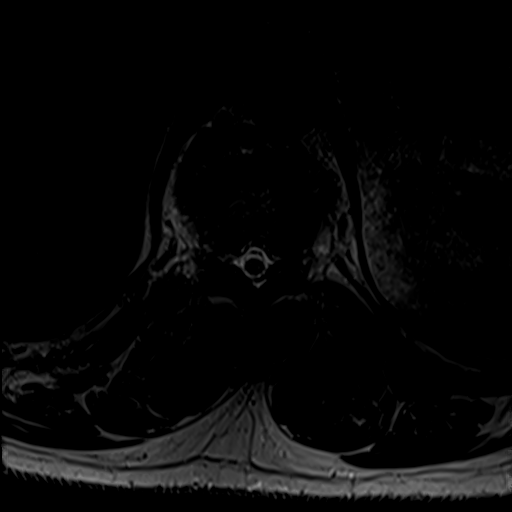
[im 16/46]
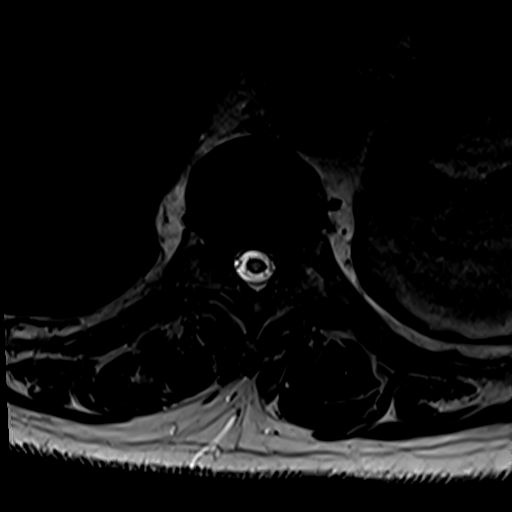
[im 19/46]
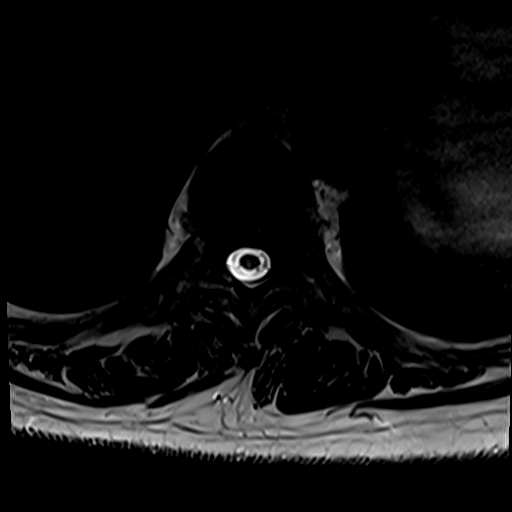
[im 23/46]
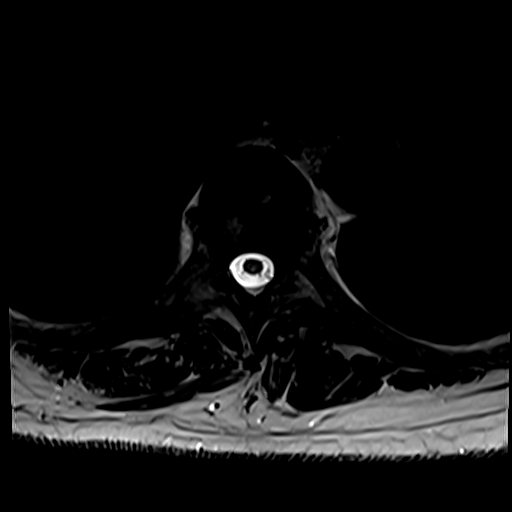
[im 27/46]
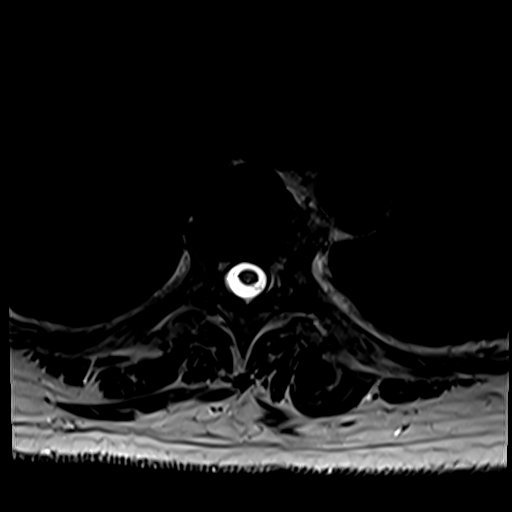
[im 31/46]
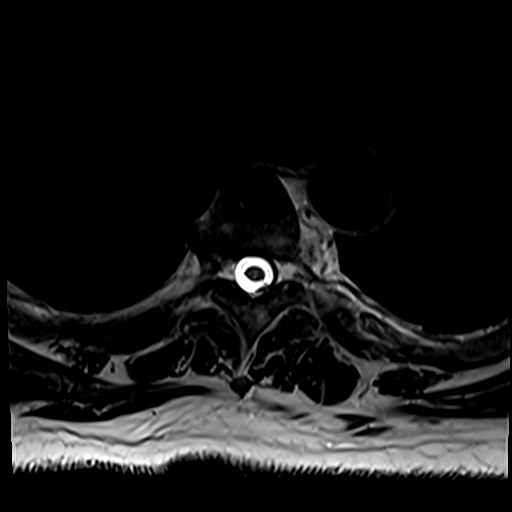
[im 38/46]
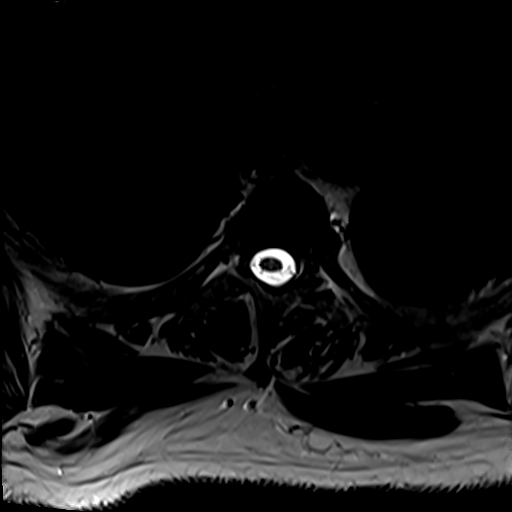
[im 46/46]
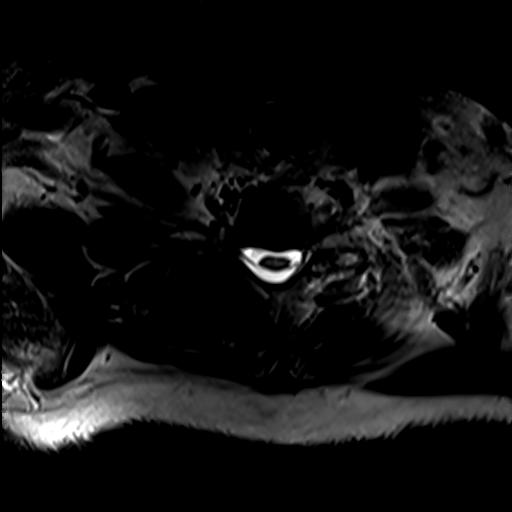

[28 of 48 positions shown; findings below may reference images not displayed]

FINDINGS: Alignment:  Normal

Vertebrae: Negative for acute fracture or mass. Scattered
hemangiomata most notably at T3 and T8. Heterogeneous bone marrow
with multiple Schmorl's nodes and degenerative changes.

Cord: Cord hyperintensity at T10-11 unchanged from the prior study.
Remaining cord signal normal.

Paraspinal and other soft tissues: Negative for paraspinous mass or
edema.

Disc levels:

Multilevel degenerative change in the thoracic spine.

T1-2: Moderate disc degeneration with disc bulging and spurring.
Schmorl's nodes. Mild foraminal narrowing bilaterally.

Mild disc degeneration without significant stenosis from T3 through
T9

T10-11: Disc degeneration with Schmorl's node. Broad-based disc
protrusion and endplate spurring. Bilateral facet degeneration.
Moderate spinal stenosis with cord flattening and cord
hyperintensity. No change from the prior study. Moderate foraminal
stenosis bilaterally

T11-12: Broad-based disc protrusion and bilateral facet
degeneration. Mild spinal stenosis. Moderate foraminal stenosis
bilaterally. No change from the prior study.
IMPRESSION: 1. Negative for thoracic fracture
2. Multilevel degenerative change most notably at T10-11 and  T11-12
3. Broad-based disc protrusion and bilateral facet degeneration at
T10-11. Moderate spinal stenosis and moderate foraminal stenosis
bilaterally. Chronic cord hyperintensity unchanged from the prior
study compatible with compressive myelopathy
4. Mild spinal stenosis T11-12 with moderate foraminal stenosis
bilaterally. No interval change.

## 2020-08-24 ENCOUNTER — Other Ambulatory Visit: Payer: Self-pay | Admitting: Neurosurgery

## 2020-09-06 NOTE — Pre-Procedure Instructions (Signed)
Surgical Instructions    Your procedure is scheduled on Thursday, May 5th.  Report to [facility] Main Entrance "A" at 11:50 A.M., then check in with the Admitting office.  Call this number if you have problems the morning of surgery:  [phone]   If you have any questions prior to your surgery date call [phone]: Open Monday-Friday 8am-4pm    Remember:  Do not eat or drink after midnight the night before your surgery    Take these medicines the morning of surgery with A SIP OF WATER  amLODipine (NORVASC) atorvastatin (LIPITOR)  omeprazole (PRILOSEC)  tamsulosin (FLOMAX)  As of today, STOP taking any Aspirin (unless otherwise instructed by your surgeon) Aleve, Naproxen, Ibuprofen, Motrin, Advil, Goody's, BC's, all herbal medications, fish oil, and all vitamins.                     Do NOT Smoke (Tobacco/Vaping) or drink Alcohol 24 hours prior to your procedure.  If you use a CPAP at night, you may bring all equipment for your overnight stay.   Contacts, glasses, piercing's, hearing aid's, dentures or partials may not be worn into surgery, please bring cases for these belongings.    For patients admitted to the hospital, discharge time will be determined by your treatment team.   Patients discharged the day of surgery will not be allowed to drive home, and someone needs to stay with them for 24 hours.    Special instructions:   [place]- Preparing For Surgery  Before surgery, you can play an important role. Because skin is not sterile, your skin needs to be as free of germs as possible. You can reduce the number of germs on your skin by washing with CHG (chlorahexidine gluconate) Soap before surgery.  CHG is an antiseptic cleaner which kills germs and bonds with the skin to continue killing germs even after washing.    Oral Hygiene is also important to reduce your risk of infection.  Remember - BRUSH YOUR TEETH THE MORNING OF SURGERY WITH YOUR REGULAR  TOOTHPASTE  Please do not use if you have an allergy to CHG or antibacterial soaps. If your skin becomes reddened/irritated stop using the CHG.  Do not shave (including legs and underarms) for at least 48 hours prior to first CHG shower. It is OK to shave your face.  Please follow these instructions carefully.   1. Shower the NIGHT BEFORE SURGERY and the MORNING OF SURGERY  2. If you chose to wash your hair, wash your hair first as usual with your normal shampoo.  3. After you shampoo, rinse your hair and body thoroughly to remove the shampoo.  4. Use CHG Soap as you would any other liquid soap. You can apply CHG directly to the skin and wash gently with a scrungie or a clean washcloth.   5. Apply the CHG Soap to your body ONLY FROM THE NECK DOWN.  Do not use on open wounds or open sores. Avoid contact with your eyes, ears, mouth and genitals (private parts). Wash Face and genitals (private parts)  with your normal soap.   6. Wash thoroughly, paying special attention to the area where your surgery will be performed.  7. Thoroughly rinse your body with warm water from the neck down.  8. DO NOT shower/wash with your normal soap after using and rinsing off the CHG Soap.  9. Pat yourself dry with a CLEAN TOWEL.  10. Wear CLEAN PAJAMAS to bed the night before  surgery  11. Place CLEAN SHEETS on your bed the night before your surgery  12. DO NOT SLEEP WITH PETS.   Day of Surgery: Shower with CHG soap. Do not wear jewelry, make up, or nail polish Do not wear lotions, powders, colognes, or deodorant. Men may shave face and neck. Do not bring valuables to the hospital. [facility] is not responsible for any belongings or valuables. Wear Clean/Comfortable clothing the morning of surgery Remember to brush your teeth WITH YOUR REGULAR TOOTHPASTE.   Please read over the following fact sheets that you were given.

## 2020-09-07 ENCOUNTER — Encounter (HOSPITAL_COMMUNITY): Payer: Self-pay

## 2020-09-07 ENCOUNTER — Other Ambulatory Visit: Payer: Self-pay

## 2020-09-07 ENCOUNTER — Encounter (HOSPITAL_COMMUNITY)
Admission: RE | Admit: 2020-09-07 | Discharge: 2020-09-07 | Disposition: A | Discharge status: Home / Self Care | Payer: Medicare Other | Source: Ambulatory Visit | Attending: Neurosurgery | Admitting: Neurosurgery

## 2020-09-07 DIAGNOSIS — Z20822 Contact with and (suspected) exposure to covid-19: Secondary | ICD-10-CM | POA: Diagnosis not present

## 2020-09-07 DIAGNOSIS — Z01818 Encounter for other preprocedural examination: Secondary | ICD-10-CM | POA: Insufficient documentation

## 2020-09-07 LAB — CBC
HCT: 40.3 % (ref 39.0–52.0)
Hemoglobin: 12.8 g/dL — ABNORMAL LOW (ref 13.0–17.0)
MCH: 22.7 pg — ABNORMAL LOW (ref 26.0–34.0)
MCHC: 31.8 g/dL (ref 30.0–36.0)
MCV: 71.3 fL — ABNORMAL LOW (ref 80.0–100.0)
Platelets: 221 10*3/uL (ref 150–400)
RBC: 5.65 MIL/uL (ref 4.22–5.81)
RDW: 13.5 % (ref 11.5–15.5)
WBC: 5.5 10*3/uL (ref 4.0–10.5)
nRBC: 0 % (ref 0.0–0.2)

## 2020-09-07 LAB — SURGICAL PCR SCREEN
MRSA, PCR: NEGATIVE
Staphylococcus aureus: NEGATIVE

## 2020-09-07 LAB — SARS CORONAVIRUS 2 (TAT 6-24 HRS): SARS Coronavirus 2: NEGATIVE

## 2020-09-07 LAB — BASIC METABOLIC PANEL
Anion gap: 9 (ref 5–15)
BUN: 6 mg/dL — ABNORMAL LOW (ref 8–23)
CO2: 22 mmol/L (ref 22–32)
Calcium: 9 mg/dL (ref 8.9–10.3)
Chloride: 105 mmol/L (ref 98–111)
Creatinine, Ser: 0.97 mg/dL (ref 0.61–1.24)
GFR, Estimated: >60 mL/min (ref >60)
Glucose, Bld: 115 mg/dL — ABNORMAL HIGH (ref 70–99)
Potassium: 4 mmol/L (ref 3.5–5.1)
Sodium: 136 mmol/L (ref 135–145)

## 2020-09-07 NOTE — Progress Notes (Signed)
   09/07/20 0946  OBSTRUCTIVE SLEEP APNEA  Have you ever been diagnosed with sleep apnea through a sleep study? No  Do you snore loudly (loud enough to be heard through closed doors)?  1  Do you often feel tired, fatigued, or sleepy during the daytime (such as falling asleep during driving or talking to someone)? 1  Has anyone observed you stop breathing during your sleep? 1  Do you have, or are you being treated for high blood pressure? 1  BMI more than 35 kg/m2? 1  Age > 50 (1-yes) 1  Neck circumference greater than:Male 16 inches or larger, Male 17inches or larger? 1  Male Gender (Yes=1) 1  Obstructive Sleep Apnea Score 8  Score 5 or greater  Results sent to PCP

## 2020-09-07 NOTE — Progress Notes (Signed)
PCP - Ranae Plumber, NP Cardiologist - patient denies  PPM/ICD - n/a Device Orders -  Rep Notified -   Chest x-ray - n/a EKG - 09/07/20 Stress Test - patient denies ECHO - patient denies Cardiac Cath - patient denies  Sleep Study - n/a, positive stop bang, sent to PCP CPAP -   Fasting Blood Sugar - n/a Checks Blood Sugar _____ times a day  Blood Thinner Instructions: n/a Aspirin Instructions: n/a  ERAS Protcol - n/a PRE-SURGERY Ensure or G2-   COVID TEST- at PAT appointment 09/07/20   Anesthesia review: n/a  Patient denies shortness of breath, fever, cough and chest pain at PAT appointment   All instructions explained to the patient, with a verbal understanding of the material. Patient agrees to go over the instructions while at home for a better understanding. Patient also instructed to self quarantine after being tested for COVID-19. The opportunity to ask questions was provided.

## 2020-09-09 ENCOUNTER — Encounter (HOSPITAL_COMMUNITY)
Admission: RE | Disposition: A | Discharge status: Home / Self Care | Payer: Self-pay | Source: Home / Self Care | Attending: Neurosurgery

## 2020-09-09 ENCOUNTER — Ambulatory Visit (HOSPITAL_COMMUNITY): Payer: Medicare Other | Admitting: Certified Registered"

## 2020-09-09 ENCOUNTER — Encounter (HOSPITAL_COMMUNITY): Payer: Self-pay | Admitting: Neurosurgery

## 2020-09-09 ENCOUNTER — Observation Stay (HOSPITAL_COMMUNITY)
Admission: RE | Admit: 2020-09-09 | Discharge: 2020-09-14 | Disposition: A | Discharge status: Home / Self Care | Payer: Medicare Other | Attending: Neurosurgery | Admitting: Neurosurgery

## 2020-09-09 DIAGNOSIS — Z87891 Personal history of nicotine dependence: Secondary | ICD-10-CM | POA: Insufficient documentation

## 2020-09-09 DIAGNOSIS — R2681 Unsteadiness on feet: Secondary | ICD-10-CM | POA: Insufficient documentation

## 2020-09-09 DIAGNOSIS — Z20822 Contact with and (suspected) exposure to covid-19: Secondary | ICD-10-CM | POA: Diagnosis not present

## 2020-09-09 DIAGNOSIS — M4804 Spinal stenosis, thoracic region: Principal | ICD-10-CM | POA: Insufficient documentation

## 2020-09-09 HISTORY — PX: LUMBAR LAMINECTOMY/DECOMPRESSION MICRODISCECTOMY: SHX5026

## 2020-09-09 LAB — CREATININE, SERUM
Creatinine, Ser: 1.19 mg/dL (ref 0.61–1.24)
GFR, Estimated: >60 mL/min (ref >60)

## 2020-09-09 LAB — CBC
HCT: 39.3 % (ref 39.0–52.0)
Hemoglobin: 12.1 g/dL — ABNORMAL LOW (ref 13.0–17.0)
MCH: 22.3 pg — ABNORMAL LOW (ref 26.0–34.0)
MCHC: 30.8 g/dL (ref 30.0–36.0)
MCV: 72.4 fL — ABNORMAL LOW (ref 80.0–100.0)
Platelets: 216 10*3/uL (ref 150–400)
RBC: 5.43 MIL/uL (ref 4.22–5.81)
RDW: 13.6 % (ref 11.5–15.5)
WBC: 8.4 10*3/uL (ref 4.0–10.5)
nRBC: 0 % (ref 0.0–0.2)

## 2020-09-09 SURGERY — LUMBAR LAMINECTOMY/DECOMPRESSION MICRODISCECTOMY 1 LEVEL
Anesthesia: General | Site: Spine Lumbar

## 2020-09-09 MED ORDER — ONDANSETRON HCL 4 MG/2ML IJ SOLN: 4.0000 mg | Freq: Four times a day (QID) | INTRAMUSCULAR | Status: DC | PRN

## 2020-09-09 MED ORDER — FENTANYL CITRATE (PF) 100 MCG/2ML IJ SOLN
INTRAMUSCULAR | Status: DC | PRN
  Administered 2020-09-09 (×2): 50 ug via INTRAVENOUS
  Administered 2020-09-09: 150 ug via INTRAVENOUS

## 2020-09-09 MED ORDER — OXYCODONE HCL 5 MG PO TABS
10.0000 mg | ORAL_TABLET | ORAL | Status: DC | PRN
Start: 2020-09-09 — End: 2020-09-15
  Administered 2020-09-09 – 2020-09-12 (×6): 10 mg via ORAL
  Filled 2020-09-09 (×6): qty 2

## 2020-09-09 MED ORDER — ORAL CARE MOUTH RINSE: 15.0000 mL | Freq: Once | OROMUCOSAL | Status: AC

## 2020-09-09 MED ORDER — LATANOPROST 0.005 % OP SOLN
1.0000 [drp] | Freq: Every day | OPHTHALMIC | Status: DC
  Administered 2020-09-09 – 2020-09-13 (×5): 1 [drp] via OPHTHALMIC
  Filled 2020-09-09: qty 2.5

## 2020-09-09 MED ORDER — MENTHOL 3 MG MT LOZG: 1.0000 | LOZENGE | OROMUCOSAL | Status: DC | PRN

## 2020-09-09 MED ORDER — ALUM & MAG HYDROXIDE-SIMETH 200-200-20 MG/5ML PO SUSP: 30.0000 mL | Freq: Four times a day (QID) | ORAL | Status: DC | PRN

## 2020-09-09 MED ORDER — CHLORHEXIDINE GLUCONATE CLOTH 2 % EX PADS: 6.0000 | MEDICATED_PAD | Freq: Once | CUTANEOUS | Status: DC

## 2020-09-09 MED ORDER — THROMBIN 5000 UNITS EX SOLR
CUTANEOUS | Status: AC
  Filled 2020-09-09: qty 5000

## 2020-09-09 MED ORDER — ONDANSETRON HCL 4 MG PO TABS: 4.0000 mg | ORAL_TABLET | Freq: Four times a day (QID) | ORAL | Status: DC | PRN

## 2020-09-09 MED ORDER — HEMOSTATIC AGENTS (NO CHARGE) OPTIME
TOPICAL | Status: DC | PRN
  Administered 2020-09-09: 1 via TOPICAL

## 2020-09-09 MED ORDER — ZOLPIDEM TARTRATE 5 MG PO TABS: 5.0000 mg | ORAL_TABLET | Freq: Every evening | ORAL | Status: DC | PRN

## 2020-09-09 MED ORDER — SUGAMMADEX SODIUM 200 MG/2ML IV SOLN
INTRAVENOUS | Status: DC | PRN
  Administered 2020-09-09: 200 mg via INTRAVENOUS

## 2020-09-09 MED ORDER — VANCOMYCIN HCL IN DEXTROSE 1-5 GM/200ML-% IV SOLN
1000.0000 mg | INTRAVENOUS | Status: AC
  Administered 2020-09-09: 1000 mg via INTRAVENOUS
  Filled 2020-09-09: qty 200

## 2020-09-09 MED ORDER — AMLODIPINE BESYLATE 5 MG PO TABS
5.0000 mg | ORAL_TABLET | Freq: Every day | ORAL | Status: DC
  Administered 2020-09-10 – 2020-09-14 (×5): 5 mg via ORAL
  Filled 2020-09-09 (×5): qty 1

## 2020-09-09 MED ORDER — MORPHINE SULFATE (PF) 2 MG/ML IV SOLN: 2.0000 mg | INTRAVENOUS | Status: DC | PRN

## 2020-09-09 MED ORDER — MAGNESIUM CITRATE PO SOLN: 1.0000 | Freq: Once | ORAL | Status: DC | PRN

## 2020-09-09 MED ORDER — LIDOCAINE-EPINEPHRINE 0.5 %-1:200000 IJ SOLN
INTRAMUSCULAR | Status: AC
  Filled 2020-09-09: qty 1

## 2020-09-09 MED ORDER — ACETAMINOPHEN 10 MG/ML IV SOLN
1000.0000 mg | Freq: Once | INTRAVENOUS | Status: DC | PRN
  Administered 2020-09-09: 1000 mg via INTRAVENOUS

## 2020-09-09 MED ORDER — POTASSIUM CHLORIDE IN NACL 20-0.9 MEQ/L-% IV SOLN: INTRAVENOUS | Status: DC

## 2020-09-09 MED ORDER — BACITRACIN ZINC 500 UNIT/GM EX OINT
TOPICAL_OINTMENT | CUTANEOUS | Status: AC
  Filled 2020-09-09: qty 28.35

## 2020-09-09 MED ORDER — PROPOFOL 10 MG/ML IV BOLUS
INTRAVENOUS | Status: DC | PRN
  Administered 2020-09-09: 200 mg via INTRAVENOUS

## 2020-09-09 MED ORDER — DOCUSATE SODIUM 100 MG PO CAPS
100.0000 mg | ORAL_CAPSULE | Freq: Two times a day (BID) | ORAL | Status: DC
  Administered 2020-09-09 – 2020-09-14 (×10): 100 mg via ORAL
  Filled 2020-09-09 (×10): qty 1

## 2020-09-09 MED ORDER — HEPARIN SODIUM (PORCINE) 5000 UNIT/ML IJ SOLN
5000.0000 [IU] | Freq: Three times a day (TID) | INTRAMUSCULAR | Status: DC
  Administered 2020-09-10 – 2020-09-14 (×13): 5000 [IU] via SUBCUTANEOUS
  Filled 2020-09-09 (×13): qty 1

## 2020-09-09 MED ORDER — FENTANYL CITRATE (PF) 250 MCG/5ML IJ SOLN
INTRAMUSCULAR | Status: AC
  Filled 2020-09-09: qty 5

## 2020-09-09 MED ORDER — PROPOFOL 10 MG/ML IV BOLUS
INTRAVENOUS | Status: AC
  Filled 2020-09-09: qty 20

## 2020-09-09 MED ORDER — OXYCODONE HCL 5 MG PO TABS
5.0000 mg | ORAL_TABLET | ORAL | Status: DC | PRN
  Administered 2020-09-12 – 2020-09-14 (×5): 5 mg via ORAL
  Filled 2020-09-09 (×5): qty 1

## 2020-09-09 MED ORDER — SODIUM CHLORIDE 0.9% FLUSH: 3.0000 mL | INTRAVENOUS | Status: DC | PRN

## 2020-09-09 MED ORDER — ONDANSETRON HCL 4 MG/2ML IJ SOLN: 4.0000 mg | Freq: Once | INTRAMUSCULAR | Status: DC | PRN

## 2020-09-09 MED ORDER — ADULT MULTIVITAMIN W/MINERALS CH
1.0000 | ORAL_TABLET | Freq: Every day | ORAL | Status: DC
  Administered 2020-09-10 – 2020-09-14 (×5): 1 via ORAL
  Filled 2020-09-09 (×5): qty 1

## 2020-09-09 MED ORDER — DEXAMETHASONE SODIUM PHOSPHATE 10 MG/ML IJ SOLN
INTRAMUSCULAR | Status: DC | PRN
  Administered 2020-09-09: 10 mg via INTRAVENOUS

## 2020-09-09 MED ORDER — OXYCODONE HCL ER 10 MG PO T12A
10.0000 mg | EXTENDED_RELEASE_TABLET | Freq: Two times a day (BID) | ORAL | Status: DC
  Administered 2020-09-09 – 2020-09-14 (×10): 10 mg via ORAL
  Filled 2020-09-09 (×10): qty 1

## 2020-09-09 MED ORDER — LACTATED RINGERS IV SOLN: INTRAVENOUS | Status: DC

## 2020-09-09 MED ORDER — ROCURONIUM BROMIDE 100 MG/10ML IV SOLN
INTRAVENOUS | Status: DC | PRN
  Administered 2020-09-09: 10 mg via INTRAVENOUS
  Administered 2020-09-09: 20 mg via INTRAVENOUS
  Administered 2020-09-09: 70 mg via INTRAVENOUS

## 2020-09-09 MED ORDER — ACETAMINOPHEN 10 MG/ML IV SOLN
INTRAVENOUS | Status: AC
  Filled 2020-09-09: qty 100

## 2020-09-09 MED ORDER — TAMSULOSIN HCL 0.4 MG PO CAPS
0.4000 mg | ORAL_CAPSULE | Freq: Every day | ORAL | Status: DC
  Administered 2020-09-09 – 2020-09-14 (×6): 0.4 mg via ORAL
  Filled 2020-09-09 (×6): qty 1

## 2020-09-09 MED ORDER — DIAZEPAM 5 MG PO TABS
5.0000 mg | ORAL_TABLET | Freq: Four times a day (QID) | ORAL | Status: DC | PRN
  Administered 2020-09-09 – 2020-09-10 (×3): 5 mg via ORAL
  Filled 2020-09-09 (×3): qty 1

## 2020-09-09 MED ORDER — PANTOPRAZOLE SODIUM 40 MG PO TBEC
40.0000 mg | DELAYED_RELEASE_TABLET | Freq: Every day | ORAL | Status: DC
  Administered 2020-09-09 – 2020-09-14 (×6): 40 mg via ORAL
  Filled 2020-09-09 (×6): qty 1

## 2020-09-09 MED ORDER — CHLORHEXIDINE GLUCONATE 0.12 % MT SOLN
15.0000 mL | Freq: Once | OROMUCOSAL | Status: AC
  Administered 2020-09-09: 15 mL via OROMUCOSAL
  Filled 2020-09-09: qty 15

## 2020-09-09 MED ORDER — ACETAMINOPHEN 325 MG PO TABS: 650.0000 mg | ORAL_TABLET | ORAL | Status: DC | PRN

## 2020-09-09 MED ORDER — SENNOSIDES-DOCUSATE SODIUM 8.6-50 MG PO TABS: 1.0000 | ORAL_TABLET | Freq: Every evening | ORAL | Status: DC | PRN

## 2020-09-09 MED ORDER — LIDOCAINE-EPINEPHRINE 0.5 %-1:200000 IJ SOLN
INTRAMUSCULAR | Status: DC | PRN
  Administered 2020-09-09: 10 mL

## 2020-09-09 MED ORDER — THROMBIN 5000 UNITS EX SOLR
CUTANEOUS | Status: DC | PRN
  Administered 2020-09-09 (×2): 5000 [IU] via TOPICAL

## 2020-09-09 MED ORDER — PHENOL 1.4 % MT LIQD
1.0000 | OROMUCOSAL | Status: DC | PRN
Start: 2020-09-09 — End: 2020-09-15

## 2020-09-09 MED ORDER — ATORVASTATIN CALCIUM 10 MG PO TABS
20.0000 mg | ORAL_TABLET | Freq: Every day | ORAL | Status: DC
  Administered 2020-09-10 – 2020-09-14 (×5): 20 mg via ORAL
  Filled 2020-09-09 (×5): qty 2

## 2020-09-09 MED ORDER — SODIUM CHLORIDE 0.9 % IV SOLN: 250.0000 mL | INTRAVENOUS | Status: DC

## 2020-09-09 MED ORDER — ROCURONIUM BROMIDE 10 MG/ML (PF) SYRINGE
PREFILLED_SYRINGE | INTRAVENOUS | Status: AC
  Filled 2020-09-09: qty 10

## 2020-09-09 MED ORDER — LIDOCAINE 2% (20 MG/ML) 5 ML SYRINGE
INTRAMUSCULAR | Status: DC | PRN
  Administered 2020-09-09: 80 mg via INTRAVENOUS

## 2020-09-09 MED ORDER — LIDOCAINE 2% (20 MG/ML) 5 ML SYRINGE
INTRAMUSCULAR | Status: AC
  Filled 2020-09-09: qty 5

## 2020-09-09 MED ORDER — ACETAMINOPHEN 650 MG RE SUPP: 650.0000 mg | RECTAL | Status: DC | PRN

## 2020-09-09 MED ORDER — BUPIVACAINE HCL (PF) 0.5 % IJ SOLN
INTRAMUSCULAR | Status: AC
  Filled 2020-09-09: qty 30

## 2020-09-09 MED ORDER — BISACODYL 5 MG PO TBEC
5.0000 mg | DELAYED_RELEASE_TABLET | Freq: Every day | ORAL | Status: DC | PRN
  Administered 2020-09-14: 5 mg via ORAL
  Filled 2020-09-09: qty 1

## 2020-09-09 MED ORDER — 0.9 % SODIUM CHLORIDE (POUR BTL) OPTIME
TOPICAL | Status: DC | PRN
  Administered 2020-09-09: 1000 mL

## 2020-09-09 MED ORDER — KETOROLAC TROMETHAMINE 15 MG/ML IJ SOLN
7.5000 mg | Freq: Four times a day (QID) | INTRAMUSCULAR | Status: DC
  Administered 2020-09-09 – 2020-09-10 (×2): 7.5 mg via INTRAVENOUS
  Filled 2020-09-09 (×2): qty 1

## 2020-09-09 MED ORDER — HYDROMORPHONE HCL 1 MG/ML IJ SOLN
INTRAMUSCULAR | Status: AC
  Filled 2020-09-09: qty 1

## 2020-09-09 MED ORDER — SODIUM CHLORIDE 0.9% FLUSH
3.0000 mL | Freq: Two times a day (BID) | INTRAVENOUS | Status: DC
  Administered 2020-09-11 – 2020-09-13 (×6): 3 mL via INTRAVENOUS

## 2020-09-09 MED ORDER — BUPIVACAINE HCL (PF) 0.5 % IJ SOLN
INTRAMUSCULAR | Status: DC | PRN
  Administered 2020-09-09: 30 mL

## 2020-09-09 MED ORDER — HYDROMORPHONE HCL 1 MG/ML IJ SOLN
0.2500 mg | INTRAMUSCULAR | Status: DC | PRN
  Administered 2020-09-09 (×2): 0.5 mg via INTRAVENOUS

## 2020-09-09 SURGICAL SUPPLY — 54 items
ADH SKN CLS APL DERMABOND .7 (GAUZE/BANDAGES/DRESSINGS) ×1
APL SKNCLS STERI-STRIP NONHPOA (GAUZE/BANDAGES/DRESSINGS)
BAND INSRT 18 STRL LF DISP RB (MISCELLANEOUS) ×2
BAND RUBBER #18 3X1/16 STRL (MISCELLANEOUS) ×4 IMPLANT
BENZOIN TINCTURE PRP APPL 2/3 (GAUZE/BANDAGES/DRESSINGS) IMPLANT
BLADE CLIPPER SURG (BLADE) IMPLANT
BUR MATCHSTICK NEURO 3.0 LAGG (BURR) ×2 IMPLANT
BUR PRECISION FLUTE 5.0 (BURR) IMPLANT
CANISTER SUCT 3000ML PPV (MISCELLANEOUS) ×2 IMPLANT
CARTRIDGE OIL MAESTRO DRILL (MISCELLANEOUS) ×1 IMPLANT
COVER WAND RF STERILE (DRAPES) ×2 IMPLANT
DECANTER SPIKE VIAL GLASS SM (MISCELLANEOUS) ×2 IMPLANT
DERMABOND ADVANCED (GAUZE/BANDAGES/DRESSINGS) ×1
DERMABOND ADVANCED .7 DNX12 (GAUZE/BANDAGES/DRESSINGS) ×1 IMPLANT
DIFFUSER DRILL AIR PNEUMATIC (MISCELLANEOUS) ×2 IMPLANT
DRAPE LAPAROTOMY 100X72X124 (DRAPES) ×2 IMPLANT
DRAPE MICROSCOPE LEICA (MISCELLANEOUS) ×2 IMPLANT
DRAPE SURG 17X23 STRL (DRAPES) ×2 IMPLANT
DRSG OPSITE POSTOP 4X6 (GAUZE/BANDAGES/DRESSINGS) ×1 IMPLANT
DURAPREP 26ML APPLICATOR (WOUND CARE) ×2 IMPLANT
ELECT BLADE 4.0 EZ CLEAN MEGAD (MISCELLANEOUS) ×2
ELECT REM PT RETURN 9FT ADLT (ELECTROSURGICAL) ×2
ELECTRODE BLDE 4.0 EZ CLN MEGD (MISCELLANEOUS) IMPLANT
ELECTRODE REM PT RTRN 9FT ADLT (ELECTROSURGICAL) ×1 IMPLANT
GAUZE 4X4 16PLY RFD (DISPOSABLE) IMPLANT
GAUZE SPONGE 4X4 12PLY STRL (GAUZE/BANDAGES/DRESSINGS) IMPLANT
GLOVE ECLIPSE 6.5 STRL STRAW (GLOVE) ×3 IMPLANT
GLOVE EXAM NITRILE XL STR (GLOVE) IMPLANT
GLOVE SURG LTX SZ6.5 (GLOVE) ×3 IMPLANT
GOWN STRL REUS W/ TWL LRG LVL3 (GOWN DISPOSABLE) ×2 IMPLANT
GOWN STRL REUS W/ TWL XL LVL3 (GOWN DISPOSABLE) IMPLANT
GOWN STRL REUS W/TWL 2XL LVL3 (GOWN DISPOSABLE) IMPLANT
GOWN STRL REUS W/TWL LRG LVL3 (GOWN DISPOSABLE) ×6
GOWN STRL REUS W/TWL XL LVL3 (GOWN DISPOSABLE)
KIT BASIN OR (CUSTOM PROCEDURE TRAY) ×2 IMPLANT
KIT TURNOVER KIT B (KITS) ×2 IMPLANT
NDL HYPO 25X1 1.5 SAFETY (NEEDLE) ×1 IMPLANT
NDL SPNL 18GX3.5 QUINCKE PK (NEEDLE) IMPLANT
NEEDLE HYPO 25X1 1.5 SAFETY (NEEDLE) ×2 IMPLANT
NEEDLE SPNL 18GX3.5 QUINCKE PK (NEEDLE) IMPLANT
NS IRRIG 1000ML POUR BTL (IV SOLUTION) ×2 IMPLANT
OIL CARTRIDGE MAESTRO DRILL (MISCELLANEOUS) ×2
PACK LAMINECTOMY NEURO (CUSTOM PROCEDURE TRAY) ×2 IMPLANT
PAD ARMBOARD 7.5X6 YLW CONV (MISCELLANEOUS) ×6 IMPLANT
SPONGE LAP 4X18 RFD (DISPOSABLE) IMPLANT
SPONGE SURGIFOAM ABS GEL SZ50 (HEMOSTASIS) ×2 IMPLANT
STRIP CLOSURE SKIN 1/2X4 (GAUZE/BANDAGES/DRESSINGS) IMPLANT
SUT VIC AB 0 CT1 18XCR BRD8 (SUTURE) ×1 IMPLANT
SUT VIC AB 0 CT1 8-18 (SUTURE) ×2
SUT VIC AB 2-0 CT1 18 (SUTURE) ×2 IMPLANT
SUT VIC AB 3-0 SH 8-18 (SUTURE) ×2 IMPLANT
TOWEL GREEN STERILE (TOWEL DISPOSABLE) ×2 IMPLANT
TOWEL GREEN STERILE FF (TOWEL DISPOSABLE) ×2 IMPLANT
WATER STERILE IRR 1000ML POUR (IV SOLUTION) ×2 IMPLANT

## 2020-09-09 NOTE — Transfer of Care (Signed)
Immediate Anesthesia Transfer of Care Note  Patient: Luis Clark  Procedure(s) Performed: Thoracic eleven-twelve Laminectomy (N/A Spine Lumbar)  Patient Location: PACU  Anesthesia Type:General  Level of Consciousness: drowsy  Airway & Oxygen Therapy: Patient Spontanous Breathing and Patient connected to nasal cannula oxygen  Post-op Assessment: Report given to RN and Post -op Vital signs reviewed and stable  Post vital signs: Reviewed and stable  Last Vitals:  Vitals Value Taken Time  BP    Temp    Pulse    Resp    SpO2      Last Pain:  Vitals:   09/09/20 1229  TempSrc:   PainSc: 5       Patients Stated Pain Goal: 4 (07/62/26 3335)  Complications: No complications documented.

## 2020-09-09 NOTE — Anesthesia Preprocedure Evaluation (Addendum)
Anesthesia Evaluation  Patient identified by MRN, date of birth, ID band Patient awake    Reviewed: Allergy & Precautions, NPO status , Patient's Chart, lab work & pertinent test results  Airway Mallampati: II  TM Distance: >3 FB Neck ROM: Full    Dental no notable dental hx. (+) Loose,    Pulmonary neg pulmonary ROS, former smoker,    Pulmonary exam normal breath sounds clear to auscultation       Cardiovascular hypertension, Normal cardiovascular exam Rhythm:Regular Rate:Normal     Neuro/Psych negative neurological ROS  negative psych ROS   GI/Hepatic negative GI ROS, Neg liver ROS,   Endo/Other  negative endocrine ROS  Renal/GU negative Renal ROS  negative genitourinary   Musculoskeletal negative musculoskeletal ROS (+)   Abdominal   Peds negative pediatric ROS (+)  Hematology negative hematology ROS (+)   Anesthesia Other Findings   Reproductive/Obstetrics negative OB ROS                            Anesthesia Physical Anesthesia Plan  ASA: II  Anesthesia Plan: General   Post-op Pain Management:    Induction: Intravenous  PONV Risk Score and Plan: 2 and Ondansetron, Dexamethasone and Treatment may vary due to age or medical condition  Airway Management Planned: Oral ETT  Additional Equipment:   Intra-op Plan:   Post-operative Plan: Extubation in OR  Informed Consent: I have reviewed the patients History and Physical, chart, labs and discussed the procedure including the risks, benefits and alternatives for the proposed anesthesia with the patient or authorized representative who has indicated his/her understanding and acceptance.     Dental advisory given  Plan Discussed with: CRNA and Surgeon  Anesthesia Plan Comments:         Anesthesia Quick Evaluation

## 2020-09-09 NOTE — Anesthesia Procedure Notes (Signed)
Procedure Name: Intubation Date/Time: 09/09/2020 3:25 PM Performed by: Josephine Igo, CRNA Pre-anesthesia Checklist: Patient identified, Emergency Drugs available, Suction available and Patient being monitored Patient Re-evaluated:Patient Re-evaluated prior to induction Oxygen Delivery Method: Circle system utilized Preoxygenation: Pre-oxygenation with 100% oxygen Induction Type: IV induction Ventilation: Mask ventilation without difficulty Laryngoscope Size: Miller and 2 Grade View: Grade I Tube type: Oral Tube size: 7.5 mm Number of attempts: 1 Airway Equipment and Method: Stylet Placement Confirmation: ETT inserted through vocal cords under direct vision,  positive ETCO2 and breath sounds checked- equal and bilateral Secured at: 23 cm Tube secured with: Tape Dental Injury: Teeth and Oropharynx as per pre-operative assessment  Comments: Noted pre- intubation - right lower incisior loose.- oral airway not utilized.

## 2020-09-09 NOTE — Op Note (Signed)
09/09/2020  6:24 PM  PATIENT:  Luis Clark  70 y.o. 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 He was positioned prone on a Jackson table with all pressure points properly padded. His back was prepped and draped in a sterile manner. Using fluoroscopy I planned my incision to expose T10-12. I infiltrated the planned incision with lidocaine. I incised the skin with a Bard Parker no 10 blade and dissected sharply to the thoracolumbar fascia. I used monopolar cautery to expose the lamina of T10,11,and T12. I confirmed my location with fluoroscopy. I performed the laminectomy with the drill, Kerrison punches, and dissectors. The canal was decompressed widely. Bleeding was controlled with cautery and irrigation. I infiltrAted the paraspinous musculature with marcaine.  I closed the wound approximating the thoracolumbar fascia then the subcutaneous and subcuticular planes with vicryl. I approximated the skin edges with a nylon. I applied a sterile dressing. He was rolled supine and extubated in the OR.   PLAN OF CARE: Admit for overnight observation  PATIENT DISPOSITION:  PACU - hemodynamically stable.   Delay start of Pharmacological VTE agent (>24hrs) due to surgical blood loss or risk of bleeding:  yes

## 2020-09-09 NOTE — H&P (Signed)
BP (!) 150/91   Pulse 90   Temp 98.2 F (36.8 C) (Oral)   Resp 18   Ht 5\' 7"  (1.702 m)   Wt 111.1 kg   SpO2 97%   BMI 38.37 kg/m     Luis Clark comes in today for 2 problems.  He has a right frontal skull lesion.  He also has thoracic stenosis.  He was seen by Dr. Marcial Pacas, and we do not have any notes to see exactly why he was sent, but I will assume it is due to the thoracic stenosis which is the more emergent issue.  He has had problems with walking for at least 4 years.  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  It is always the left leg that gives out.  He has used a cane for years, but the cane at this point is unable to keep him from falling.  He falls multiple times a week and has fallen as recently as the last week.  Luis Clark also has a skull lesion, which I believe is asymptomatic, but it certainly is there. May be involving the sinus.  All we have is an MRI, so it does not actually give a good rendition of the bony anatomy.     Luis Clark has ostensively returned today so we can take another look at the head CT.  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  There was no impingement on that orbit on the optic nerve, and it is stable.  I do not know that we need to look at that again, but when Luis Clark was walking in I just noted again just how antalgic and spastic his gait was, and indeed we were going to do an operation for thoracic stenosis, but Luis Clark reasonably did not want to come in during acute time of COVID last year, because we looked at the scan in July, and during July he had a large case load of COVID, and he just was shaken up about it, which was a very reasonable thing.  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  The scan now being that old, I will have to  repeat, but we are going to go ahead and repeat it, and we are going to get him into the operating room at this point because he is tired, he says, of falling down and he is scared to walk down the street.  He uses his cane.  He has a very slow gait and it is not steady.  He weighs 258 pounds.  Temperature is 97.9 blood pressure 144/84, pulse 99.  Pain is 8/10.     PLAN :    MEDICATIONS :  As follows: Amlodipine.     ALLERGIES :  To Penicillin.     PAST SURGICAL HISTORY :  He has had right knee surgery.     SOCIAL HISTORY :  He is 70 years of age, right-handed, former smoker.  He drinks alcohol socially.     FAMILY HISTORY :  Mother and father are deceased.  He has a twin brother.       Weakness in the leg and foot are on the left.  Numbness and tingling, especially on the left.  He has in the left upper extremity and  left lower extremity, in the left upper extremity when he bends his arm or when he sleeps on it.  The left lower extremity is constant.  He has tinnitus and balance problems, swelling in the feet and/or hands, leg pain with walking, leg weakness, back pain, arm pain, and leg pain.     EXAM :  He is alert and oriented by 4.  He is a poor historian, but speech is clear.  Memory, language, attention span, and fund of knowledge are otherwise normal.  Pupils equal, round, and reactive to light.  Full extraocular movements.  Full visual fields.  Hearing intact to voice.  He has weakness in the left lower extremity approximately 4/5.  There is asymmetry of the musculature of the larger muscles being present in the right thigh and leg.  Romberg is negative.  He is unable to tandem walk.  He is unable to stand on his toes.  He is unable to heel walk.  Proprioception is intact.  Deep tendon reflexes intact 2+ biceps, triceps, brachioradialis, knees, and ankles.     IMAGING :  MRI thoracic spine shows stenosis present lower thoracic spine, possibly T11-12.  There  is no numbering on it, and I do not have a localizer on the lateral film either.  This was done by [facility].  With regard to the cranial lesion, he has some sort of frontal abnormality on the right side.  Right at the orbit adjacent to the frontal sinus. Looking like it invades some of the spinal sinus on the right side.  Nothing on the left.  There is no other lesion intercerebral.     PLAN :  I am going to order a CT of the brain, because we needed better characterization of the skull lesion.  I am going to schedule him for an operation for the thoracic spinal stenosis, because his gait is getting markedly worse and is getting worse by the day.  He is scheduled to get an EMG of the upper extremities secondary to the numbness there, by Dr. Krista Blue, and that was scheduled for August 25th.  I hope to have him in and out of the hospital by then.  We discussed the risks and benefits of the operation including bleeding, infection, no relief, need for further surgery, further weakness, bowel or bladder dysfunction, and other risks.  He agrees and understands.

## 2020-09-10 ENCOUNTER — Other Ambulatory Visit: Payer: Self-pay

## 2020-09-10 ENCOUNTER — Encounter (HOSPITAL_COMMUNITY): Payer: Self-pay | Admitting: Neurosurgery

## 2020-09-10 DIAGNOSIS — M4804 Spinal stenosis, thoracic region: Secondary | ICD-10-CM | POA: Diagnosis not present

## 2020-09-10 MED ORDER — KETOROLAC TROMETHAMINE 15 MG/ML IJ SOLN
7.5000 mg | Freq: Four times a day (QID) | INTRAMUSCULAR | Status: AC
  Administered 2020-09-10 (×2): 7.5 mg via INTRAMUSCULAR
  Filled 2020-09-10 (×2): qty 1

## 2020-09-10 NOTE — Progress Notes (Signed)
Patient was transferred to 5N23 via wheelchair by RN; in no acute distress nor complaints of discomfort; all belongings checked and taken along with him; reports given to RN Sunday Spillers before transfer was done.

## 2020-09-10 NOTE — TOC Initial Note (Signed)
Transition of Care [facility]) - Initial/Assessment Note    Patient Details  Name: Luis Clark MRN: [id number] Date of Birth: March 07, 1951  Transition of Care [facility]) CM/SW Contact:    Angelita Ingles, RN Phone Number: [phone]  09/10/2020, 2:25 PM  Clinical Narrative:                 [facility] consulted for patient requesting [facility] services versus Rehab. CM at bedside to offer choice for home health services. CM spoke with patient about rehab recommendations versus patients choice of HH. Patient states that he thinks he will need to go to rehab until he can get stronger and go like he wants to. Patient expressed interest in wanting to be in a facility near [place]. CM explained to patient that facility search for the [facility] area can be initiated. Patient gives CM verbal consent to initiate rehab search. TOC to continue to follow for placement.   Expected Discharge Plan: Skilled Nursing Facility Barriers to Discharge: Continued Medical Work up   Patient Goals and CMS Choice Patient states their goals for this hospitalization and ongoing recovery are:: Wants to get better CMS Medicare.gov Compare Post Acute Care list provided to:: Patient Choice offered to / list presented to : Patient  Expected Discharge Plan and Services Expected Discharge Plan: [place] In-house Referral: NA Discharge Planning Services: CM Consult Post Acute Care Choice: [place] Living arrangements for the past 2 months: Single Family Home                 DME Arranged: N/A DME Agency: NA       HH Arranged: NA HH Agency: NA        Prior Living Arrangements/Services Living arrangements for the past 2 months: Single Family Home Lives with:: Siblings Patient language and need for interpreter reviewed:: Yes Do you feel safe going back to the place where you live?: Yes      Need for Family Participation in Patient Care: Yes (Comment) Care giver support system in place?: Yes  (comment)   Criminal Activity/Legal Involvement Pertinent to Current Situation/Hospitalization: No - Comment as needed  Activities of Daily Living Home Assistive Devices/Equipment: Eyeglasses,Cane (specify quad or straight),Blood pressure cuff,Walker (specify type) ADL Screening (condition at time of admission) Patient's cognitive ability adequate to safely complete daily activities?: Yes Is the patient deaf or have difficulty hearing?: No Does the patient have difficulty seeing, even when wearing glasses/contacts?: No Does the patient have difficulty concentrating, remembering, or making decisions?: Yes Patient able to express need for assistance with ADLs?: Yes Does the patient have difficulty dressing or bathing?: Yes Independently performs ADLs?: No Communication: Independent Dressing (OT): Needs assistance Is this a change from baseline?: Change from baseline, expected to last <3days Grooming: Independent Feeding: Independent Bathing: Needs assistance Is this a change from baseline?: Change from baseline, expected to last <3 days Toileting: Needs assistance Is this a change from baseline?: Change from baseline, expected to last <3 days In/Out Bed: Needs assistance Is this a change from baseline?: Change from baseline, expected to last <3 days Does the patient have difficulty walking or climbing stairs?: Yes Weakness of Legs: Both Weakness of Arms/Hands: Both  Permission Sought/Granted   Permission granted to share information with : No              Emotional Assessment Appearance:: Appears stated age Attitude/Demeanor/Rapport: Gracious Affect (typically observed): Pleasant Orientation: : Oriented to Self,Oriented to Place,Oriented to  Time,Oriented to Situation Alcohol /  Substance Use: Not Applicable Psych Involvement: No (comment)  Admission diagnosis:  Thoracic spinal stenosis [M48.04] Patient Active Problem List   Diagnosis Date Noted  . Thoracic spinal  stenosis 09/09/2020  . 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 Thoracic stenosis 04/14/2020  . Left arm weakness 04/14/2020  . 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 Brain mass 12/04/2019  . Other abnormalities of gait and mobility 11/17/2019  . Left hemiparesis ([place]) 11/17/2019   PCP:  Ranae Plumber, [place] Pharmacy:   [id number] [address], [place] [address] [place] [address] Phone: [phone] Fax: [phone]  [place], [place], [address] [place], [address] [place] [address] Phone: [phone] Fax: [phone]     Social Determinants of Health (SDOH) Interventions    Readmission Risk Interventions No flowsheet data found.

## 2020-09-10 NOTE — Progress Notes (Signed)
Pt transferred from [facility] via wheel chair. Report received from [place], [place].  Pt. is alert and oriented x 4. Pt transferred to bed with no complaints of pain. Call bell within reach. Will continue to monitor.

## 2020-09-10 NOTE — Evaluation (Signed)
Occupational Therapy Evaluation Patient Details Name: Luis Clark MRN: [id number] DOB: 1950-10-03 Today's Date: 09/10/2020    History of Present Illness This 70 y.o. male admitted for T11-12 laminectomy secondary to throcic stenosis.   PMH includes: HTN, GERD, enlarged prostate ; post polio syndrome, H/o multiple falls recently   Clinical Impression   Pt admitted with above. He demonstrates the below listed deficits and will benefit from continued OT to maximize safety and independence with BADLs.  Pt presents to OT with bil. LE weakness, impaired sensation bil. LEs, generalized weakness, decreased activity tolerance, impaired balance.  He currently requires set up - total A for ADLs, and min A +2 for functional mobility. He is at high risk for falls, and has a significant history of falls.  He lives with his brother, who works during the day, and has no other supports who can assist at discharge.  Recommend SNF for rehab.        Follow Up Recommendations  SNF    Equipment Recommendations  3 in 1 bedside commode;Tub/shower bench    Recommendations for Other Services       Precautions / Restrictions Precautions Precautions: Fall;Back Precaution Booklet Issued: Yes (comment) Precaution Comments: Pt instructed in back precautions.  h/o post polio syndrome, and h/o falls      Mobility Bed Mobility Overal bed mobility: Needs Assistance Bed Mobility: Rolling;Sidelying to Sit Rolling: Min assist Sidelying to sit: Min assist       General bed mobility comments: verbal cues for log rolling technique and assist to roll and push self up    Transfers Overall transfer level: Needs assistance Equipment used: Rolling walker (2 wheeled) Transfers: Sit to/from [facility] Sit to Stand: Min guard Stand pivot transfers: Min guard;+2 physical assistance;+2 safety/equipment       General transfer comment: Pt shaky during transfer.  Requires verbal cues for walker  safety    Balance                                           ADL either performed or assessed with clinical judgement   ADL Overall ADL's : Needs assistance/impaired Eating/Feeding: Independent   Grooming: Wash/dry hands;Wash/dry face;Oral care;Brushing hair;Minimal assistance;Standing   Upper Body Bathing: Set up;Supervision/ safety;Sitting   Lower Body Bathing: Maximal assistance;Sit to/from stand   Upper Body Dressing : Minimal assistance;Sitting   Lower Body Dressing: Total assistance;Sit to/from stand   Toilet Transfer: Minimal assistance;Ambulation;Comfort height toilet;Grab bars;RW   Toileting- Clothing Manipulation and Hygiene: Maximal assistance;Sit to/from stand       Functional mobility during ADLs: Minimal assistance;+2 for physical assistance;+2 for safety/equipment General ADL Comments: Pt unable to access feet, and is unable to unweight UE while standing with RW to perform ADLs     Vision Baseline Vision/History: Wears glasses Wears Glasses: At all times Patient Visual Report: No change from baseline       Perception     Praxis      Pertinent Vitals/Pain Pain Assessment: Faces Faces Pain Scale: Hurts a little bit Pain Location: back Pain Descriptors / Indicators: Operative site guarding Pain Intervention(s): Monitored during session     Hand Dominance Right   Extremity/Trunk Assessment Upper Extremity Assessment Upper Extremity Assessment: Generalized weakness (bil. UEs tremulous as he fatigues)   Lower Extremity Assessment Lower Extremity Assessment: Defer to PT evaluation   Cervical / Trunk Assessment Cervical /  Trunk Assessment: Other exceptions Cervical / Trunk Exceptions: s/p surgery   Communication Communication Communication: No difficulties   Cognition Arousal/Alertness: Awake/alert Behavior During Therapy: WFL for tasks assessed/performed Overall Cognitive Status: Within Functional Limits for tasks assessed                                  General Comments: Pt is a bit slow to process and respond to questions - anticipate this is his baseline   General Comments       Exercises     Shoulder Instructions      Home Living Family/patient expects to be discharged to:: Private residence Living Arrangements: Other relatives Available Help at Discharge: Family;Available PRN/intermittently Type of Home: House Home Access: Stairs to enter [employer] of Steps: 3 Entrance Stairs-Rails: Left Home Layout: Two level;Able to live on main level with bedroom/bathroom Alternate Level Stairs-Number of Steps: full flight   Bathroom Shower/Tub: Tub/shower unit;Curtain   Bathroom Toilet: Standard     Home Equipment: Kasandra Knudsen - single point   Additional Comments: Pt lives with his brother who works during the day.      Prior Functioning/Environment Level of Independence: Independent with assistive device(s)        Comments: Pt reports he has been performing ADLs mod I, but has had direct supervision for shower/tub transfer due to multiple falls getting in and out of tub        OT Problem List: Decreased strength;Decreased activity tolerance;Impaired balance (sitting and/or standing);Decreased knowledge of use of DME or AE;Decreased knowledge of precautions      OT Treatment/Interventions: Self-care/ADL training;DME and/or AE instruction;Therapeutic activities;Therapeutic exercise;Neuromuscular education;Patient/family education;Balance training    OT Goals(Current goals can be found in the care plan section) Acute Rehab OT Goals Patient Stated Goal: to be able to get stronger and not fall OT Goal Formulation: With patient Time For Goal Achievement: 09/24/20 Potential to Achieve Goals: Good ADL Goals Pt Will Perform Grooming: with min guard assist;standing Pt Will Perform Lower Body Bathing: with min guard assist;with adaptive equipment;sit to/from stand Pt Will Perform  Lower Body Dressing: with min guard assist;sit to/from stand;with adaptive equipment Pt Will Transfer to Toilet: with min guard assist;ambulating;regular height toilet;bedside commode;grab bars Pt Will Perform Toileting - Clothing Manipulation and hygiene: with min guard assist;sit to/from stand  OT Frequency: Min 2X/week   Barriers to D/C: Decreased caregiver support          Co-evaluation PT/OT/SLP Co-Evaluation/Treatment: Yes Reason for Co-Treatment: For patient/therapist safety;To address functional/ADL transfers   OT goals addressed during session: ADL's and self-care      AM-PAC OT "6 Clicks" Daily Activity     Outcome Measure Help from another person eating meals?: None Help from another person taking care of personal grooming?: A Little Help from another person toileting, which includes using toliet, bedpan, or urinal?: A Lot Help from another person bathing (including washing, rinsing, drying)?: A Lot Help from another person to put on and taking off regular upper body clothing?: A Little Help from another person to put on and taking off regular lower body clothing?: Total 6 Click Score: 15   End of Session Equipment Utilized During Treatment: Rolling walker;Gait belt Nurse Communication: Mobility status  Activity Tolerance: Patient tolerated treatment well Patient left: in chair;with call bell/phone within reach  OT Visit Diagnosis: Unsteadiness on feet (R26.81)  Time: 7262-0355 OT Time Calculation (min): 39 min Charges:  OT General Charges $OT Visit: 1 Visit OT Evaluation $OT Eval Moderate Complexity: 1 Mod OT Treatments $Therapeutic Activity: 8-22 mins  Nilsa Nutting., OTR/L Acute Rehabilitation Services Pager [phone] Office [phone]   Lucille Passy M 09/10/2020, 9:58 AM

## 2020-09-10 NOTE — Anesthesia Postprocedure Evaluation (Signed)
Anesthesia Post Note  Patient: CAYLE THUNDER  Procedure(s) Performed: Thoracic eleven-twelve Laminectomy (N/A Spine Lumbar)     Patient location during evaluation: PACU Anesthesia Type: General Level of consciousness: awake and alert Pain management: pain level controlled Vital Signs Assessment: post-procedure vital signs reviewed and stable Respiratory status: spontaneous breathing, nonlabored ventilation, respiratory function stable and patient connected to nasal cannula oxygen Cardiovascular status: blood pressure returned to baseline and stable Postop Assessment: no apparent nausea or vomiting Anesthetic complications: no   No complications documented.  Last Vitals:  Vitals:   09/10/20 0457 09/10/20 0745  BP: 140/83 130/87  Pulse: 75 93  Resp: 18 17  Temp: 36.5 C (!) 36.4 C  SpO2: 96% 97%    Last Pain:  Vitals:   09/10/20 0745  TempSrc: Oral  PainSc:                  Audry Pili

## 2020-09-10 NOTE — Progress Notes (Signed)
Patient ID: Luis Clark, male   DOB: Sep 09, 1950, 70 y.o.   MRN: [id number] BP 115/66 (BP Location: Right Arm)   Pulse 79   Temp 97.8 F (36.6 C) (Oral)   Resp 18   Ht 5\' 7"  (1.702 m)   Wt 111.1 kg   SpO2 93%   BMI 38.37 kg/m  Alert and oriented x 4, speech is clear and fluent Moving all extremities Dressing is dry, no signs of infection Will need snf placement

## 2020-09-10 NOTE — Care Management Obs Status (Signed)
[place] NOTIFICATION   Patient Details  Name: VERN GUERETTE MRN: [id number] Date of Birth: April 11, 1951   Medicare Observation Status Notification Given:  Yes    Angelita Ingles, RN 09/10/2020, 3:14 PM

## 2020-09-10 NOTE — Evaluation (Signed)
Physical Therapy Evaluation Patient Details Name: Luis Clark MRN: [id number] DOB: 01-22-1951 Today's Date: 09/10/2020   History of Present Illness  Pt is a 70 y/o male who presents s/p T11-T12 laminectomy 2 thoracic stenosis. PMH significant for post polio symdrome, HTN, enlarged prostate, multiple recent falls.    Clinical Impression  Pt admitted with above diagnosis. At the time of PT eval, pt was able to demonstrate transfers and ambulation with gross min assist with +2 for safety/chair follow throughout session. Pt with no episodes of sudden buckling, however pt very shaky in UE's and LE's throughout mobility and noted flexed knees in weight bearing on several instances. Pt was educated on precautions, appropriate activity progression, and car transfer. Pt currently with functional limitations due to the deficits listed below (see PT Problem List). Pt will benefit from skilled PT to increase their independence and safety with mobility to allow discharge to the venue listed below.      Follow Up Recommendations SNF;Supervision for mobility/OOB    Equipment Recommendations  Rolling walker with 5" wheels    Recommendations for Other Services       Precautions / Restrictions Precautions Precautions: Fall;Back Precaution Booklet Issued: Yes (comment) Precaution Comments: Pt instructed in back precautions.  h/o post polio syndrome, and h/o falls Restrictions Weight Bearing Restrictions: No      Mobility  Bed Mobility               General bed mobility comments: Pt was received sitting up on EOB with OT    Transfers Overall transfer level: Needs assistance Equipment used: Rolling walker (2 wheeled) Transfers: Sit to/from [facility] Sit to Stand: Min guard Stand pivot transfers: Min guard;+2 physical assistance;+2 safety/equipment       General transfer comment: Pt shaky during transfer. VC's for hand placement on seated surface for safety. Hands-on  guarding for safety.  Ambulation/Gait Ambulation/Gait assistance: +2 safety/equipment;Min assist Gait Distance (Feet): 100 Feet Assistive device: Rolling walker (2 wheeled) Gait Pattern/deviations: Step-through pattern;Decreased stride length;Trunk flexed Gait velocity: Decreased Gait velocity interpretation: <1.31 ft/sec, indicative of household ambulator General Gait Details: Decreased floor clearance but not a true shuffling pattern. Frequent cues for improved posture, closer walker proximity, and forward gaze. Chair follow utilized as pt shaky and with periods of flexed knees during weight bearing. No sudden buckles noted, however.  Stairs            Wheelchair Mobility    Modified Rankin (Stroke Patients Only)       Balance Overall balance assessment: Needs assistance Sitting-balance support: Feet supported;No upper extremity supported Sitting balance-Leahy Scale: Fair     Standing balance support: Bilateral upper extremity supported;During functional activity Standing balance-Leahy Scale: Poor Standing balance comment: Heavily reliant on UE support                             Pertinent Vitals/Pain Pain Assessment: Faces Faces Pain Scale: Hurts a little bit Pain Location: back Pain Descriptors / Indicators: Operative site guarding Pain Intervention(s): Limited activity within patient's tolerance;Monitored during session;Repositioned    Home Living Family/patient expects to be discharged to:: Private residence Living Arrangements: Other relatives Available Help at Discharge: Family;Available PRN/intermittently Type of Home: House Home Access: Stairs to enter Entrance Stairs-Rails: Left Entrance Stairs-Number of Steps: 3 Home Layout: Two level;Able to live on main level with bedroom/bathroom Home Equipment: Kasandra Knudsen - single point Additional Comments: Pt lives with his brother who works during  the day.    Prior Function Level of Independence:  Independent with assistive device(s)         Comments: Pt reports he has been performing ADLs mod I, but has had direct supervision for shower/tub transfer due to multiple falls getting in and out of tub     Hand Dominance   Dominant Hand: Right    Extremity/Trunk Assessment   Upper Extremity Assessment Upper Extremity Assessment: Defer to OT evaluation    Lower Extremity Assessment Lower Extremity Assessment: Generalized weakness (Unclear if he has LE pain and numbness in BLE's or just one. States LLE was worse PTA and now RLE is worse possibly?)    Cervical / Trunk Assessment Cervical / Trunk Assessment: Other exceptions Cervical / Trunk Exceptions: s/p surgery  Communication   Communication: No difficulties  Cognition Arousal/Alertness: Awake/alert Behavior During Therapy: WFL for tasks assessed/performed Overall Cognitive Status: Within Functional Limits for tasks assessed                                 General Comments: Pt is a bit slow to process and respond to questions - anticipate this is his baseline      General Comments      Exercises     Assessment/Plan    PT Assessment Patient needs continued PT services  PT Problem List Decreased strength;Decreased activity tolerance;Decreased balance;Decreased mobility;Decreased knowledge of use of DME;Decreased safety awareness;Decreased knowledge of precautions;Pain;Decreased cognition       PT Treatment Interventions DME instruction;Gait training;Stair training;Functional mobility training;Therapeutic activities;Therapeutic exercise;Neuromuscular re-education;Patient/family education    PT Goals (Current goals can be found in the Care Plan section)  Acute Rehab PT Goals Patient Stated Goal: to be able to get stronger and not fall PT Goal Formulation: With patient Time For Goal Achievement: 09/17/20 Potential to Achieve Goals: Good    Frequency Min 5X/week   Barriers to discharge Decreased  caregiver support      Co-evaluation PT/OT/SLP Co-Evaluation/Treatment: Yes Reason for Co-Treatment: For patient/therapist safety;To address functional/ADL transfers PT goals addressed during session: Mobility/safety with mobility;Balance;Proper use of DME         AM-PAC PT "6 Clicks" Mobility  Outcome Measure Help needed turning from your back to your side while in a flat bed without using bedrails?: A Little Help needed moving from lying on your back to sitting on the side of a flat bed without using bedrails?: A Little Help needed moving to and from a bed to a chair (including a wheelchair)?: A Little Help needed standing up from a chair using your arms (e.g., wheelchair or bedside chair)?: A Little Help needed to walk in hospital room?: A Little Help needed climbing 3-5 steps with a railing? : A Lot 6 Click Score: 17    End of Session Equipment Utilized During Treatment: Gait belt Activity Tolerance: Patient tolerated treatment well Patient left: in chair;with call bell/phone within reach Nurse Communication: Mobility status;Precautions PT Visit Diagnosis: Unsteadiness on feet (R26.81);Pain Pain - part of body:  (back)    Time: 0737-1062 PT Time Calculation (min) (ACUTE ONLY): 24 min   Charges:   PT Evaluation $PT Eval Moderate Complexity: 1 Mod PT Treatments $Gait Training: 8-22 mins        Rolinda Roan, PT, DPT Acute Rehabilitation Services Pager: [phone] Office: [phone]   Thelma Comp 09/10/2020, 1:12 PM

## 2020-09-11 DIAGNOSIS — M4804 Spinal stenosis, thoracic region: Secondary | ICD-10-CM | POA: Diagnosis not present

## 2020-09-11 NOTE — NC FL2 (Signed)
[place] LEVEL OF CARE SCREENING TOOL     IDENTIFICATION  Patient Name: Luis Clark Birthdate: February 07, 1951 Sex: male Admission Date (Current Location): 09/09/2020  [facility] and [place] Number:  Engineering geologist and Address:  [facility] [place], [place] [address], [place], [place] [address]      Provider Number: [id number]  Attending Physician Name and Address:  Ashok Pall, MD  Relative Name and Phone Number:  Lynnette Caffey, brother, [phone]    Current Level of Care: Hospital Recommended Level of Care: [place] Prior Approval Number:    Date Approved/Denied:   PASRR Number: [id number]  Discharge Plan: SNF    Current Diagnoses: Patient Active Problem List   Diagnosis Date Noted  . Thoracic spinal stenosis 09/09/2020  . 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 Thoracic stenosis 04/14/2020  . Left arm weakness 04/14/2020  . 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 Brain mass 12/04/2019  . Other abnormalities of gait and mobility 11/17/2019  . Left hemiparesis ([place]) 11/17/2019    Orientation RESPIRATION BLADDER Height & Weight     Self,Time,Situation,Place  Normal Continent Weight: 245 lb (111.1 kg) Height:  5\' 7"  (170.2 cm)  BEHAVIORAL SYMPTOMS/MOOD NEUROLOGICAL BOWEL NUTRITION STATUS      Continent Diet (Please see DC Summary)  AMBULATORY STATUS COMMUNICATION OF NEEDS Skin   Limited Assist Verbally Surgical wounds (Closed incision on back)                       Personal Care Assistance Level of Assistance  Bathing,Feeding,Dressing Bathing Assistance: Limited assistance Feeding assistance: Independent Dressing Assistance: Limited assistance     Functional Limitations Info             SPECIAL CARE FACTORS FREQUENCY  PT (By licensed PT),OT (By licensed OT)     PT Frequency: 5x/week OT Frequency: 5x/week            Contractures Contractures Info: Not present    Additional  Factors Info  Code Status,Allergies Code Status Info: Full Allergies Info: Penicillin G, Penicillins           Current Medications (09/11/2020):  This is the current hospital active medication list Current Facility-Administered Medications  Medication Dose Route Frequency Provider Last Rate Last Admin  . 0.9 %  sodium chloride infusion  250 mL Intravenous Continuous Ashok Pall, MD      . 0.9 % NaCl with KCl 20 mEq/ L  infusion   Intravenous Continuous Ashok Pall, MD      . acetaminophen (TYLENOL) tablet 650 mg  650 mg Oral Q4H PRN Ashok Pall, MD       Or  . acetaminophen (TYLENOL) suppository 650 mg  650 mg Rectal Q4H PRN Ashok Pall, MD      . alum & mag hydroxide-simeth (MAALOX/MYLANTA) 200-200-20 MG/5ML suspension 30 mL  30 mL Oral Q6H PRN Ashok Pall, MD      . amLODipine (NORVASC) tablet 5 mg  5 mg Oral Daily Ashok Pall, MD   5 mg at 09/11/20 0929  . atorvastatin (LIPITOR) tablet 20 mg  20 mg Oral Daily Ashok Pall, MD   20 mg at 09/11/20 0928  . bisacodyl (DULCOLAX) EC tablet 5 mg  5 mg Oral Daily PRN Ashok Pall, MD      . diazepam (VALIUM) tablet 5 mg  5 mg Oral Q6H PRN Ashok Pall, MD   5 mg at 09/10/20 1505  . docusate sodium (COLACE)  capsule 100 mg  100 mg Oral BID Ashok Pall, MD   100 mg at 09/11/20 0929  . heparin injection 5,000 Units  5,000 Units Subcutaneous Q8H Ashok Pall, MD   5,000 Units at 09/11/20 [phone]  . latanoprost (XALATAN) 0.005 % ophthalmic solution 1 drop  1 drop Both Eyes QHS Ashok Pall, MD   1 drop at 09/10/20 2031  . magnesium citrate solution 1 Bottle  1 Bottle Oral Once PRN Ashok Pall, MD      . menthol-cetylpyridinium (CEPACOL) lozenge 3 mg  1 lozenge Oral PRN Ashok Pall, MD       Or  . phenol (CHLORASEPTIC) mouth spray 1 spray  1 spray Mouth/Throat PRN Ashok Pall, MD      . morphine 2 MG/ML injection 2 mg  2 mg Intravenous Q2H PRN Ashok Pall, MD      . multivitamin with minerals tablet 1 tablet  1 tablet Oral  Daily Ashok Pall, MD   1 tablet at 09/11/20 [phone]  . ondansetron (ZOFRAN) tablet 4 mg  4 mg Oral Q6H PRN Ashok Pall, MD       Or  . ondansetron (ZOFRAN) injection 4 mg  4 mg Intravenous Q6H PRN Ashok Pall, MD      . oxyCODONE (Oxy IR/ROXICODONE) immediate release tablet 10 mg  10 mg Oral Q3H PRN Ashok Pall, MD   10 mg at 09/11/20 7322  . oxyCODONE (Oxy IR/ROXICODONE) immediate release tablet 5 mg  5 mg Oral Q3H PRN Ashok Pall, MD      . oxyCODONE (OXYCONTIN) 12 hr tablet 10 mg  10 mg Oral Q12H Ashok Pall, MD   10 mg at 09/11/20 0928  . pantoprazole (PROTONIX) EC tablet 40 mg  40 mg Oral Daily Ashok Pall, MD   40 mg at 09/11/20 0929  . senna-docusate (Senokot-S) tablet 1 tablet  1 tablet Oral QHS PRN Ashok Pall, MD      . sodium chloride flush (NS) 0.9 % injection 3 mL  3 mL Intravenous Q12H Ashok Pall, MD   3 mL at 09/11/20 0929  . sodium chloride flush (NS) 0.9 % injection 3 mL  3 mL Intravenous PRN Ashok Pall, MD      . tamsulosin (FLOMAX) capsule 0.4 mg  0.4 mg Oral Daily Ashok Pall, MD   0.4 mg at 09/11/20 0929  . zolpidem (AMBIEN) tablet 5 mg  5 mg Oral QHS PRN Ashok Pall, MD         Discharge Medications: Please see discharge summary for a list of discharge medications.  Relevant Imaging Results:  Relevant Lab Results:   Additional Information SSN: [id number]. Moderna vaccines on 06/18/19, 07/16/19, 03/26/20  Benard Halsted, LCSW

## 2020-09-11 NOTE — Progress Notes (Signed)
Physical Therapy Treatment Patient Details Name: Luis Clark MRN: [id number] DOB: [birth year] Today's Date: 09/11/2020    History of Present Illness Pt is a 69 y/o male who presents s/p T11-T12 laminectomy 2 thoracic stenosis. PMH significant for post polio symdrome, HTN, enlarged prostate, multiple recent falls.    PT Comments    Pt received in supine, pleasantly agreeable to therapy session and with good participation and tolerance for short household distance gait trial and transfer training. Pt needs cues for recall of back precautions and log rolling and benefits from slow single step cues for sequencing tasks and maintaining precautions during mobility tasks. He needs up to [facility] for functional mobility tasks and remains very reliant on RW support and frequent cues for proper technique/safety. Pt quick to fatigue and does report dizziness with transfers however VSS on RA aside from HR elevated to 115 bpm with exertion. Pt continues to benefit from PT services to progress toward functional mobility goals.  Continue to recommend SNF.  Follow Up Recommendations  SNF;Supervision for mobility/OOB     Equipment Recommendations  Rolling walker with 5" wheels    Recommendations for Other Services       Precautions / Restrictions Precautions Precautions: Fall;Back Precaution Booklet Issued: Yes (comment) Precaution Comments: Pt instructed in back precautions.  h/o post polio syndrome, and h/o falls Restrictions Weight Bearing Restrictions: No    Mobility  Bed Mobility Overal bed mobility: Needs Assistance Bed Mobility: Rolling;Sidelying to Sit;Sit to Sidelying Rolling: Min assist Sidelying to sit: Mod assist     Sit to sidelying: Min assist General bed mobility comments: needs 1-step cues for sequencing with log roll and tactile cues to avoid twisting back with rolling; modA for trunk rise even with railing and partial HOB elevated    Transfers Overall transfer level: Needs  assistance Equipment used: Rolling walker (2 wheeled) Transfers: Sit to/from [facility] Sit to Stand: Min assist         General transfer comment: from EOB<>RW, needs single-step cues for hand placement; increased time for trunk extension and very wide BOS.  Ambulation/Gait Ambulation/Gait assistance: Min assist Gait Distance (Feet): 40 Feet Assistive device: Rolling walker (2 wheeled) Gait Pattern/deviations: Step-through pattern;Decreased stride length;Trunk flexed;Wide base of support;Decreased dorsiflexion - right;Decreased dorsiflexion - left Gait velocity: Decreased Gait velocity interpretation: <1.8 ft/sec, indicate of risk for recurrent falls General Gait Details: Decreased floor clearance but not a true shuffling pattern. Frequent cues for improved posture, closer walker proximity, and forward gaze. No buckling but very effortful gait pattern and frequent brief standing breaks; pt reports dizziness but BP stable, see below. HR elevated to 115 bpm with exertion, SpO2 WNL; very fatigued after gait in room.   Stairs             Wheelchair Mobility    Modified Rankin (Stroke Patients Only)       Balance Overall balance assessment: Needs assistance Sitting-balance support: Feet supported;No upper extremity supported Sitting balance-Leahy Scale: Fair     Standing balance support: Bilateral upper extremity supported;During functional activity Standing balance-Leahy Scale: Poor Standing balance comment: Heavily reliant on UE support and increased postural sway initially           Cognition Arousal/Alertness: Awake/alert Behavior During Therapy: WFL for tasks assessed/performed Overall Cognitive Status: Within Functional Limits for tasks assessed            General Comments: Pt is a bit slow to process and respond to questions - anticipate this is his baseline;  good participation as able, needs single step cues.      Exercises Other  Exercises Other Exercises: verbal review for supine BLE AROM: ankle pumps, quad sets, heel slides, hip abduction, glute sets and pt performed x5 reps ea with teachback    General Comments General comments (skin integrity, edema, etc.): HR 95-115 bpm with exertion, SpO2 96% on RA, BP 110/89 (97) seated and BP 137/96 (110) standing      Pertinent Vitals/Pain Pain Assessment: 0-10 Pain Score: 6  Pain Location: LBP Pain Descriptors / Indicators: Operative site guarding;Grimacing;Tingling;Sharp Pain Intervention(s): Limited activity within patient's tolerance;Monitored during session;Premedicated before session;Repositioned     PT Goals (current goals can now be found in the care plan section) Acute Rehab PT Goals Patient Stated Goal: to be able to get stronger and not fall, maybe dance. PT Goal Formulation: With patient Time For Goal Achievement: 09/17/20 Potential to Achieve Goals: Good Progress towards PT goals: Progressing toward goals (slow progress, quick to fatigue)    Frequency    Min 5X/week      PT Plan Current plan remains appropriate       AM-PAC PT "6 Clicks" Mobility   Outcome Measure  Help needed turning from your back to your side while in a flat bed without using bedrails?: A Little Help needed moving from lying on your back to sitting on the side of a flat bed without using bedrails?: A Lot Help needed moving to and from a bed to a chair (including a wheelchair)?: A Little Help needed standing up from a chair using your arms (e.g., wheelchair or bedside chair)?: A Little Help needed to walk in hospital room?: A Little Help needed climbing 3-5 steps with a railing? : A Lot 6 Click Score: 16    End of Session Equipment Utilized During Treatment: Gait belt Activity Tolerance: Patient tolerated treatment well;Patient limited by fatigue Patient left: with call bell/phone within reach;in bed;with bed alarm set (heels floated, pt deferring SCDs encouraged ankle  pumps t/o day) Nurse Communication: Mobility status;Precautions PT Visit Diagnosis: Unsteadiness on feet (R26.81);Pain Pain - part of body:  (back)     Time: 2703-5009 PT Time Calculation (min) (ACUTE ONLY): 27 min  Charges:  $Gait Training: 8-22 mins $Therapeutic Activity: 8-22 mins                     Quenna Doepke P., PTA Acute Rehabilitation Services Pager: [phone] Office: [place] 09/11/2020, 2:36 PM

## 2020-09-11 NOTE — Progress Notes (Signed)
Patient ID: Luis Clark, male   DOB: 03/27/1951, 70 y.o.   MRN: [id number] BP 126/83 (BP Location: Left Arm)   Pulse 95   Temp 99.5 F (37.5 C) (Oral)   Resp 18   Ht 5\' 7"  (1.702 m)   Wt 111.1 kg   SpO2 95%   BMI 38.37 kg/m  Alert and oriented x4 Wound is clean, dry, without signs of infection Doing well Continuing with PT Awaiting placement

## 2020-09-12 DIAGNOSIS — M4804 Spinal stenosis, thoracic region: Secondary | ICD-10-CM | POA: Diagnosis not present

## 2020-09-12 NOTE — Plan of Care (Signed)
  Problem: Education: Goal: Ability to verbalize activity precautions or restrictions will improve Outcome: Progressing Goal: Knowledge of the prescribed therapeutic regimen will improve Outcome: Progressing Goal: Understanding of discharge needs will improve Outcome: Progressing   Problem: Activity: Goal: Ability to avoid complications of mobility impairment will improve Outcome: Progressing Goal: Ability to tolerate increased activity will improve Outcome: Progressing Goal: Will remain free from falls Outcome: Progressing   Problem: Bowel/Gastric: Goal: Gastrointestinal status for postoperative course will improve Outcome: Progressing   Problem: Clinical Measurements: Goal: Ability to maintain clinical measurements within normal limits will improve Outcome: Progressing Goal: Postoperative complications will be avoided or minimized Outcome: Progressing Goal: Diagnostic test results will improve Outcome: Progressing   Problem: Pain Management: Goal: Pain level will decrease Outcome: Progressing   Problem: Skin Integrity: Goal: Will show signs of wound healing Outcome: Progressing   Problem: Health Behavior/Discharge Planning: Goal: Identification of resources available to assist in meeting health care needs will improve Outcome: Progressing   Problem: Bladder/Genitourinary: Goal: Urinary functional status for postoperative course will improve Outcome: Progressing   Problem: Safety: Goal: Ability to remain free from injury will improve Outcome: Progressing   

## 2020-09-12 NOTE — Progress Notes (Signed)
Patient ID: Luis Clark, male   DOB: 1950/11/22, 70 y.o.   MRN: [id number] BP 114/80 (BP Location: Left Arm)   Pulse 92   Temp 98.7 F (37.1 C) (Oral)   Resp 18   Ht 5\' 7"  (1.702 m)   Wt 111.1 kg   SpO2 98%   BMI 38.37 kg/m  Alert and oriented x 4 Moving lower extremities well Wound is clean, dry, without signs of infection Awaiting placement

## 2020-09-12 NOTE — Plan of Care (Signed)
  Problem: Activity: Goal: Ability to avoid complications of mobility impairment will improve Outcome: Progressing Goal: Ability to tolerate increased activity will improve Outcome: Progressing Goal: Will remain free from falls Outcome: Progressing   Problem: Pain Management: Goal: Pain level will decrease Outcome: Progressing   Problem: Skin Integrity: Goal: Will show signs of wound healing Outcome: Progressing   

## 2020-09-13 DIAGNOSIS — M4804 Spinal stenosis, thoracic region: Secondary | ICD-10-CM | POA: Diagnosis not present

## 2020-09-13 NOTE — TOC Progression Note (Signed)
Transition of Care [facility]) - Progression Note    Patient Details  Name: Luis Clark MRN: [id number] Date of Birth: 09/11/1950  Transition of Care [facility]) CM/SW Contact  Milinda Antis, [place] Phone Number: 09/13/2020, 4:25 PM  Clinical Narrative:    CSW meet with the patient at bedside.  The patient was presented with bed offers and choose [facility].  CSW was informed that the wrong member ID was listed for the patient.  The [facility] Medicare Member ID is [id number].    Information was given to the [facility] CMA's to complete insurance auth.  Insurance Josem Kaufmann is pending.   Expected Discharge Plan: [place] Barriers to Discharge: Insurance Authorization,Continued Medical Work up  Expected Discharge Plan and Services Expected Discharge Plan: [place] In-house Referral: NA Discharge Planning Services: CM Consult Post Acute Care Choice: [place] Living arrangements for the past 2 months: Single Family Home                 DME Arranged: N/A DME Agency: NA       HH Arranged: NA HH Agency: NA         Social Determinants of Health (SDOH) Interventions    Readmission Risk Interventions No flowsheet data found.

## 2020-09-13 NOTE — Progress Notes (Signed)
Physical Therapy Treatment Patient Details Name: Luis Clark MRN: [id number] DOB: [birth year] Today's Date: 09/13/2020    History of Present Illness Pt is a 70 y/o male who presents s/p T11-T12 laminectomy 2 thoracic stenosis. PMH significant for post polio symdrome, HTN, enlarged prostate, multiple recent falls.    PT Comments    Pt progressing well compared to previous day. Pt denies dizziness as long as he doesn't lift head up towards ceiling. Worked on standing upright in walker and looking forward. Pt with continued L drop foot and report of onset of numbness and tightness in bilat LEs with ambulation requiring him to sit down. Pt to continue to benefit from SNF upon d/c to allow for increased time to achieve safe mod I level of function.    Follow Up Recommendations  SNF;Supervision for mobility/OOB     Equipment Recommendations  Rolling walker with 5" wheels    Recommendations for Other Services       Precautions / Restrictions Precautions Precautions: Fall;Back Precaution Booklet Issued: Yes (comment) Precaution Comments: pt re-educated on back precautions Restrictions Weight Bearing Restrictions: No    Mobility  Bed Mobility               General bed mobility comments: pt received coming out of bathroom    Transfers Overall transfer level: Needs assistance Equipment used: Rolling walker (2 wheeled) Transfers: Sit to/from Stand Sit to Stand: Min assist         General transfer comment: minA to steady during transition of hands from bed to RW, verbal cues to push up from walker, increased time  Ambulation/Gait Ambulation/Gait assistance: Min guard Gait Distance (Feet): 80 Feet Assistive device: Rolling walker (2 wheeled) Gait Pattern/deviations: Step-through pattern;Decreased stride length;Trunk flexed;Wide base of support;Decreased dorsiflexion - right;Decreased dorsiflexion - left Gait velocity: Decreased Gait velocity interpretation: <1.31  ft/sec, indicative of household ambulator General Gait Details: pt with noted L drop foot in which patient said was there before surgery, pt with minimal R foot clearance but step through, not shuffling. Pt with 3 standing rest breaks, by the end of the 90' of ambulation pt reports bilat LE to feel numb and tight/swollen and that they were going to give out requiring pt to sit. s/p rest attempted to amb again however pt with noted knee buckling requirng to sit in chair again. focused on standing up right and looking forward   Stairs             Wheelchair Mobility    Modified Rankin (Stroke Patients Only)       Balance Overall balance assessment: Needs assistance Sitting-balance support: Feet supported;No upper extremity supported Sitting balance-Leahy Scale: Fair     Standing balance support: Bilateral upper extremity supported;During functional activity Standing balance-Leahy Scale: Poor Standing balance comment: Heavily reliant on UE support and increased postural sway initially                            Cognition Arousal/Alertness: Awake/alert Behavior During Therapy: WFL for tasks assessed/performed Overall Cognitive Status: Within Functional Limits for tasks assessed                                        Exercises      General Comments General comments (skin integrity, edema, etc.): pt coming out of bathroom with RN tech, VSS  Pertinent Vitals/Pain Pain Assessment: 0-10 Pain Score: 8  Pain Location: LBP Pain Descriptors / Indicators: Operative site guarding;Grimacing;Tingling;Sharp Pain Intervention(s): Limited activity within patient's tolerance    Home Living                      Prior Function            PT Goals (current goals can now be found in the care plan section) Progress towards PT goals: Progressing toward goals    Frequency    Min 5X/week      PT Plan Current plan remains appropriate     Co-evaluation              AM-PAC PT "6 Clicks" Mobility   Outcome Measure  Help needed turning from your back to your side while in a flat bed without using bedrails?: A Little Help needed moving from lying on your back to sitting on the side of a flat bed without using bedrails?: A Little Help needed moving to and from a bed to a chair (including a wheelchair)?: A Little Help needed standing up from a chair using your arms (e.g., wheelchair or bedside chair)?: A Little Help needed to walk in hospital room?: A Little Help needed climbing 3-5 steps with a railing? : A Lot 6 Click Score: 17    End of Session Equipment Utilized During Treatment: Gait belt Activity Tolerance: Patient tolerated treatment well;Patient limited by fatigue Patient left: in chair;with call bell/phone within reach;with chair alarm set Nurse Communication: Mobility status;Precautions PT Visit Diagnosis: Unsteadiness on feet (R26.81);Pain     Time: 3570-1779 PT Time Calculation (min) (ACUTE ONLY): 20 min  Charges:  $Gait Training: 8-22 mins                     Kittie Plater, PT, DPT Acute Rehabilitation Services Pager #: [phone] Office #: [phone]    Berline Lopes 09/13/2020, 11:07 AM

## 2020-09-13 NOTE — Progress Notes (Signed)
Patient ID: Luis Clark, male   DOB: [birth year], 70 y.o.   MRN: [id number] BP (!) 127/95   Pulse 97   Temp 98.1 F (36.7 C) (Oral)   Resp 17   Ht 5\' 7"  (1.702 m)   Wt 111.1 kg   SpO2 98%   BMI 38.37 kg/m  Alert and oriented x 4 Moving well Waiting on a snf Wound is clean ,and dry, no signs of infection

## 2020-09-13 NOTE — Progress Notes (Signed)
Auth has been submitted for patient via [employer] portal

## 2020-09-14 DIAGNOSIS — M4804 Spinal stenosis, thoracic region: Secondary | ICD-10-CM | POA: Diagnosis not present

## 2020-09-14 LAB — RESP PANEL BY RT-PCR (FLU A&B, COVID) ARPGX2
Influenza A by PCR: NEGATIVE
Influenza B by PCR: NEGATIVE
SARS Coronavirus 2 by RT PCR: NEGATIVE

## 2020-09-14 MED ORDER — TIZANIDINE HCL 4 MG PO TABS: 4.0000 mg | ORAL_TABLET | Freq: Four times a day (QID) | ORAL | 0 refills | Status: AC | PRN

## 2020-09-14 MED ORDER — OXYCODONE HCL 5 MG PO TABS: 5.0000 mg | ORAL_TABLET | Freq: Four times a day (QID) | ORAL | 0 refills | Status: AC | PRN

## 2020-09-14 NOTE — Discharge Summary (Signed)
Physician Discharge Summary  Patient ID: Luis Clark MRN: [id number] DOB/AGE: Oct 02, 1950 70 y.o.  Admit date: 09/09/2020 Discharge date: 09/14/2020  Admission Diagnoses: thoracic stenosis  Discharge Diagnoses: same T10-12 Active Problems:   Thoracic spinal stenosis   Discharged Condition: good  Hospital Course: Luis Clark was admitted and taken to the operating room for an uncomplicated thoracic laminectomy. Post op PT felt he would be appropriate as he is not yet ready for independence. Wound is clean, dry, no signs of infection.  Voiding, ambulating with assistance, tolerating a regular diet.  Treatments: surgery: T10-12 thoracic laminectomy for decompression  Discharge Exam: Blood pressure (!) 126/97, pulse 84, temperature 97.7 F (36.5 C), temperature source Oral, resp. rate 16, height 5\' 7"  (1.702 m), weight 111.1 kg, SpO2 95 %. General appearance: alert, cooperative, appears stated age, no distress and moving lower extremities well. wound is clean, dry, no signs of infection  Disposition: Discharge disposition: [place] Not Defined      Thoracic Stenosis  Allergies as of 09/14/2020      Reactions   Penicillin G Swelling   Penicillins Swelling   Inflammation 70 years old      Medication List    TAKE these medications   amLODipine 5 MG tablet Commonly known as: NORVASC Take 5 mg by mouth daily.   atorvastatin 20 MG tablet Commonly known as: LIPITOR Take 20 mg by mouth daily.   Lumigan 0.01 % Soln Generic drug: bimatoprost Place 1 drop into both eyes at bedtime.   multivitamin tablet Take 1 tablet by mouth daily.   omeprazole 20 MG capsule Commonly known as: PRILOSEC Take 20 mg by mouth daily.   oxyCODONE 5 MG immediate release tablet Commonly known as: Oxy IR/ROXICODONE Take 1 tablet (5 mg total) by mouth every 6 (six) hours as needed for up to 8 days for moderate pain ((score 4 to 6)).   tamsulosin 0.4 MG Caps  capsule Commonly known as: FLOMAX Take 0.4 mg by mouth daily.   tiZANidine 4 MG tablet Commonly known as: ZANAFLEX Take 1 tablet (4 mg total) by mouth every 6 (six) hours as needed for muscle spasms.   Viagra 50 MG tablet Generic drug: sildenafil Take 50 mg by mouth daily as needed for erectile dysfunction.       Contact information for after-discharge care    [facility] .   Service: Skilled Nursing Contact information: [place] [phone]                  Signed: Ashok Pall 09/14/2020, 1:42 PM

## 2020-09-14 NOTE — TOC Transition Note (Signed)
Transition of Care [facility]) - CM/SW Discharge Note   Patient Details  Name: Luis Clark MRN: [id number] Date of Birth: 06/11/1950  [facility]) CM/SW Contact:  Milinda Antis, [place] Phone Number: 09/14/2020, 2:17 PM   Clinical Narrative:    Patient will DC to: [facility] Anticipated DC date: 09/14/2020 Family notified: Yes Transport by: Corey Harold   Per MD patient ready for DC to . RN to call report prior to discharge [phone].  RN, patient, patient's family, and facility notified of DC. Discharge Summary and FL2 sent to facility. DC packet on chart. Ambulance transport requested for patient.   CSW will sign off for now as social work intervention is no longer needed. Please consult [place] again if new needs arise.     Final next level of care: Skilled Nursing Facility Barriers to Discharge: Barriers Resolved   Patient Goals and CMS Choice Patient states their goals for this hospitalization and ongoing recovery are:: Wants to get better CMS Medicare.gov Compare Post Acute Care list provided to:: Patient Choice offered to / list presented to : Patient  Discharge Placement              Patient [facility] bed at: [facility] Patient to be transferred to facility by: [facility] Name of family member notified: Jarema,Morris (Brother)   [phone] Patient and family notified of of transfer: 09/14/20  Discharge Plan and Services In-house Referral: NA Discharge Planning Services: CM Consult Post Acute Care Choice: [place]          DME Arranged: N/A DME Agency: NA       HH Arranged: NA HH Agency: NA        Social Determinants of Health (SDOH) Interventions     Readmission Risk Interventions No flowsheet data found.

## 2020-09-14 NOTE — Progress Notes (Signed)
[employer] [facility] auth approved: [id number].  Good for 5/9/022 - 09/15/2020 with next review due on 09/15/2020.

## 2020-09-14 NOTE — Discharge Instructions (Signed)
Lumbar Laminectomy Care After A laminectomy is an operation performed on the spine. The purpose is to decompress the spinal cord and/or the nerve roots.  The time in surgery depends on the findings in surgery and what is necessary to correct the problems. HOME CARE INSTRUCTIONS   Check the cut (incision) made by the surgeon twice a day for signs of infection. Some signs of infection may include:   A foul smelling, greenish or yellowish discharge from the wound.   Increased pain.   Increased redness over the incision (operative) site.   The skin edges may separate.   Flu-like symptoms (problems).   A temperature above 101.5 F (38.6 C).   Change your bandages in about 24 to 36 hours following surgery or as directed.   You may shower tomorrow. Avoid bathtubs, swimming pools and hot tubs for three weeks or until your incision has healed completely. If you have stitches or staples, they may be removed 2 to 3 weeks after surgery, or as directed by your doctor. This may be done by your doctor or caregiver.   You may walk as much as you like. No need to exercise at this time. Limit lifting to ~10lbs.  Weight reduction may be beneficial if you are overweight.   Daily exercise is helpful to prevent the return of problems. Walking is permitted. You may use a treadmill without an incline. Cut down on activities and exercise if you have discomfort. You may also go up and down stairs as much as you can tolerate.   DO NOT lift anything heavier than 10 . Avoid bending or twisting at the waist. Always bend your knees when lifting.   Maintain strength and range of motion as instructed.   Do not drive for 2 to 3 weeks, or as directed by your doctors. You may be a passenger for 20 to 30 minute trips. Lying back in the passenger seat may be more comfortable for you. Always wear a seatbelt.   Limit your sitting in a regular chair to 20 to 30 minutes at a time. There are no limitations for sitting in a  recliner. You should lie down or walk in between sitting periods.   Only take over-the-counter or prescription medicines for pain, discomfort, or fever as directed by your caregiver.  SEEK MEDICAL CARE IF:   There is increased bleeding (more than a small spot) from the wound.   You notice redness, swelling, or increasing pain in the wound.   Pus is coming from wound.   You develop an unexplained oral temperature above 102 F (38.9 C) develops.   You notice a foul smell coming from the wound or dressing.   You have increasing pain in your wound.  SEEK IMMEDIATE MEDICAL CARE IF:   You develop a rash.   You have difficulty breathing.   You develop any allergic problems to medicines given.   

## 2020-09-14 NOTE — TOC Progression Note (Signed)
Transition of Care [facility]) - Progression Note    Patient Details  Name: Luis Clark MRN: [id number] Date of Birth: [birth year]  Transition of Care [facility]) CM/SW Contact  Milinda Antis, LCSWA Phone Number: 09/14/2020, 11:42 AM  Clinical Narrative:    CSW contacted the SNF, [facility]. The facility has extended a bed offer.     Pending- d/c summary and COVID test results.   Expected Discharge Plan: [place] Barriers to Discharge: Insurance Authorization,Continued Medical Work up  Expected Discharge Plan and Services Expected Discharge Plan: [place] In-house Referral: NA Discharge Planning Services: CM Consult Post Acute Care Choice: [place] Living arrangements for the past 2 months: Single Family Home                 DME Arranged: N/A DME Agency: NA       HH Arranged: NA HH Agency: NA         Social Determinants of Health (SDOH) Interventions    Readmission Risk Interventions No flowsheet data found.

## 2020-10-11 ENCOUNTER — Ambulatory Visit: Payer: Medicare Other | Admitting: Neurology

## 2020-10-13 ENCOUNTER — Ambulatory Visit: Payer: Medicare Other | Admitting: Neurology

## 2020-10-21 ENCOUNTER — Ambulatory Visit (INDEPENDENT_AMBULATORY_CARE_PROVIDER_SITE_OTHER): Payer: Medicare Other | Admitting: Neurology

## 2020-10-21 ENCOUNTER — Encounter: Payer: Self-pay | Admitting: Neurology

## 2020-10-21 VITALS — BP 122/76 | HR 95 | Ht 67.0 in | Wt 253.5 lb

## 2020-10-21 DIAGNOSIS — M25512 Pain in left shoulder: Secondary | ICD-10-CM | POA: Diagnosis not present

## 2020-10-21 DIAGNOSIS — M4804 Spinal stenosis, thoracic region: Secondary | ICD-10-CM

## 2020-10-21 DIAGNOSIS — R269 Unspecified abnormalities of gait and mobility: Secondary | ICD-10-CM | POA: Insufficient documentation

## 2020-10-21 DIAGNOSIS — M792 Neuralgia and neuritis, unspecified: Secondary | ICD-10-CM | POA: Diagnosis not present

## 2020-10-21 DIAGNOSIS — G8929 Other chronic pain: Secondary | ICD-10-CM

## 2020-10-21 MED ORDER — DULOXETINE HCL 60 MG PO CPEP: 60.0000 mg | ORAL_CAPSULE | Freq: Every day | ORAL | 11 refills | Status: DC

## 2020-10-21 MED ORDER — GABAPENTIN 300 MG PO CAPS: 300.0000 mg | ORAL_CAPSULE | Freq: Three times a day (TID) | ORAL | 11 refills | Status: DC

## 2020-10-21 NOTE — Progress Notes (Signed)
ASSESSMENT AND PLAN  Luis Clark is a 70 y.o. male   Thoracic stenosis at T10-11, with T2 hyperintensity cord signal changes Status post thoracic T10-11-12 laminectomy for spinal decompression by Dr. Christella Noa on Sep 06 2020. Continue to have gait abnormality, severe left knee extension, adduction, ankle dorsiflexion, eversion weakness, absent left knee reflex, suggestive of left L2,, 3, 4 radiculopathy MRI of lumbar, thoracic spine.  Neuropathic pain Cymbalta 60 mg daily, gabapentin 300 mg 3 times daily  Left shoulder pain, Significant left shoulder tenderness upon deep palpation, suggestive of left shoulder pathology.  Refer to orthopedic surgeon    Right frontal fibrosis dysplasia -CT head August 2021 ordered by Dr. 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  HISTORICAL  Luis Clark, is a 70 years old male seen in request by his orthopedic surgeon Dr. Gladstone Lighter for evaluation of left leg weakness, numbness, initial evaluation is on November 17, 2019, he is accompanied by his significant others at today's visit.  I reviewed and summarized the referring note.  Past medical history of Hypertension, taking Norvasc 5 mg daily Hyperlipidemia, Lipitor 20 mg daily Acid reflux, omeprazole 20 mg daily  He is a poor historian, reported lifelong history of left side difficulty, he was enlisted in the [employer], was told he his left leg is shorter, he had no trouble walking, but he did reported with prolonged running, he would notice difficulty, his friend who has known him for 20 years, noted that he has a limp gait for many years.  His left side difficulty gradually getting worse, especially since last 5 years, he noticed increased left leg numbness weakness, numbness involving whole left leg from hip, left leg weakness,  difficulty picking up against gravity.,  He also noticed worsening left arm weakness, numbness, he denies speech difficulty, no dysphagia, no bowel and bladder incontinence,  Personally reviewed MRI of lumbar spine [facility] on December 27, 2019, multilevel lumbar degenerative changes 311 2 L1 partial demonstration of bilateral posterolateral and the fracture foraminal disc protrusion with squatting effacement probable significant foraminal stenosis bilaterally, L4-5 moderate central and left foraminal stenosis, variable degree of foraminal stenosis at the other levels  UPDATE Apr 13 2020: I personally reviewed MRI of the brain without contrast on December 04, 2019, right frontal heterogeneous calvarial lesion, expanding the diploic space, mass-effect upon the right inferior frontal lobe, right orbit, right frontal sinus, no abnormal signal within the brain parenchyma  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 Christella Noa on Sep 06 2020.  Surgery did help him walking better, spend more time up on his feet, but still has dense bilateral lower extremity numbness, left worse than right, especially when bearing weight,  significant gait abnormalities, especially He denies bowel bladder incontinence   REVIEW OF SYSTEMS: Full 14 system review of systems performed and notable only for as above  See HPI  ALLERGIES: Allergies  Allergen Reactions   Penicillin G Swelling   Penicillins Swelling    Inflammation 70 years old     HOME MEDICATIONS: Current Outpatient Medications  Medication Sig Dispense Refill   amLODipine (NORVASC) 5 MG tablet Take 5 mg by mouth daily.     atorvastatin (LIPITOR) 20 MG tablet Take 20 mg by mouth daily.     LUMIGAN 0.01 % SOLN Place 1 drop into both eyes at bedtime.      Multiple Vitamin (MULTIVITAMIN) tablet Take 1 tablet by mouth daily.     omeprazole (PRILOSEC) 20 MG capsule Take 20 mg by mouth daily.     tamsulosin (FLOMAX) 0.4 MG CAPS capsule Take 0.4 mg by mouth daily.     tiZANidine (ZANAFLEX) 4 MG tablet Take 1 tablet (4 mg total) by mouth every 6 (six) hours as needed for muscle spasms. 60 tablet 0   VIAGRA 50 MG tablet Take 50 mg by mouth daily as needed for erectile dysfunction.     No current facility-administered medications for this visit.    PAST MEDICAL HISTORY: Past Medical History:  Diagnosis Date   Cataract    Enlarged prostate    GERD (gastroesophageal reflux disease)    HLD (hyperlipidemia)    Hypertension     PAST SURGICAL HISTORY: none FAMILY HISTORY: History reviewed. No pertinent family history.  SOCIAL HISTORY: Social History   Socioeconomic History   Marital status: Legally Separated    Spouse name: Not on file   Number of children: Not on file   Years of education: Not on file   Highest education level: Not on file  Occupational History   Not on file  Tobacco Use   Smoking status: Former    Years: 40.00    Pack years: 0.00    Types: Cigarettes   Smokeless tobacco: Never  Vaping Use   Vaping Use: Never used  Substance and Sexual Activity   Alcohol use: Yes    Alcohol/week: 3.0 - 4.0 standard drinks    Types: 3 - 4  Cans of beer per week   Drug use: Not Currently   Sexual activity: Not on file  Other Topics Concern   Not on file  Social History Narrative   Not on file   Social Determinants of Health   Financial Resource Strain: Not on file  Food Insecurity: Not on file  Transportation Needs: Not on file  Physical Activity: Not on file  Stress: Not on file  Social Connections: Not on file  Intimate Partner Violence: Not on file   PHYSICAL EXAM   Vitals:   10/21/20 1258  BP: 122/76  Pulse: 95  Weight: 253 lb 8 oz (115 kg)  Height: 5\' 7"  (1.702 m)   Not recorded     Body mass index is 39.7 kg/m.  PHYSICAL EXAMNIATION:  Gen: NAD, conversant, well nourised, well groomed                     NEUROLOGICAL EXAM:  MENTAL STATUS: Speech/Cognition: Awake, alert, oriented to history taking and casual conversation  CRANIAL NERVES: CN II: Visual fields are full to confrontation. Pupils are round equal and briskly reactive to light. CN III, IV, VI: extraocular movement are normal. No ptosis. CN V: Facial sensation is intact to light  touch CN VII: Face is symmetric with normal eye closure  CN VIII: Hearing is normal to causal conversation. CN IX, X: Phonation is normal. CN XI: Head turning and shoulder shrug are intact  MOTOR: UE Shoulder Abduction Shoulder External Rotation Elbow Flexion Elbow  Extension Pronation Supination Wrist Flexion Wrist Extension Grip Finger  Abduction Finger Flexion /Extension  R 5 5 5 5 5 5 5 5 5 5  5/5  L 4Limited due to left shoulder pain 4Limited due to left shoulder pain 5 4Limited due to left shoulder pain 5 5 5 5 5 5  5/5   LE Hip Flexion Knee flexion Knee extension Ankle Dorsiflexion Eversion Ankle plantar Flexion Inversion  R 5 5 5 5 5 5 5   L 2 4 3  3+ 3+ 4 4    REFLEXES: Symmetric bilateral upper extremity, Knee R/L 3/0, ankle 0/0  SENSORY: Decreased sensation to soft touch at the left lower extremity from thigh to  toe  COORDINATION: Difficulty lifting the left leg for heel-to-shin  GAIT/STANCE: He needs to push-up to get up from seated position, dragging left leg, difficulty locking his left knee

## 2020-10-21 NOTE — Patient Instructions (Signed)
[facility]    Address: [address], [place], [place] [address]  Phone: [phone]   

## 2020-10-25 ENCOUNTER — Telehealth: Payer: Self-pay | Admitting: Neurology

## 2020-10-25 NOTE — Telephone Encounter (Signed)
Faxed referral to [facility]. Phone: [phone]. Fax: [phone].

## 2020-11-02 ENCOUNTER — Other Ambulatory Visit: Payer: Medicare Other

## 2020-12-22 ENCOUNTER — Other Ambulatory Visit: Payer: Self-pay | Admitting: *Deleted

## 2020-12-22 MED ORDER — GABAPENTIN 300 MG PO CAPS: 300.0000 mg | ORAL_CAPSULE | Freq: Three times a day (TID) | ORAL | 3 refills | Status: DC

## 2020-12-27 ENCOUNTER — Other Ambulatory Visit: Payer: Self-pay | Admitting: *Deleted

## 2020-12-27 MED ORDER — DULOXETINE HCL 60 MG PO CPEP: 60.0000 mg | ORAL_CAPSULE | Freq: Every day | ORAL | 3 refills | Status: DC

## 2021-01-27 ENCOUNTER — Ambulatory Visit: Payer: Medicare Other | Admitting: Neurology

## 2021-03-29 ENCOUNTER — Ambulatory Visit: Payer: Medicare Other | Admitting: Neurology

## 2021-04-28 ENCOUNTER — Ambulatory Visit: Payer: Self-pay | Admitting: Internal Medicine

## 2021-05-12 DIAGNOSIS — M6281 Muscle weakness (generalized): Secondary | ICD-10-CM | POA: Diagnosis not present

## 2021-05-12 DIAGNOSIS — R2681 Unsteadiness on feet: Secondary | ICD-10-CM | POA: Diagnosis not present

## 2021-05-26 DIAGNOSIS — R2681 Unsteadiness on feet: Secondary | ICD-10-CM | POA: Diagnosis not present

## 2021-05-26 DIAGNOSIS — M6281 Muscle weakness (generalized): Secondary | ICD-10-CM | POA: Diagnosis not present

## 2021-05-26 LAB — HM HEPATITIS C SCREENING LAB: HM Hepatitis Screen: NEGATIVE

## 2021-05-31 ENCOUNTER — Encounter: Payer: Self-pay | Admitting: Family Medicine

## 2021-06-06 ENCOUNTER — Ambulatory Visit: Payer: Medicare Other | Admitting: Internal Medicine

## 2021-06-30 ENCOUNTER — Ambulatory Visit: Payer: Medicare Other | Admitting: Neurology

## 2021-08-25 ENCOUNTER — Ambulatory Visit: Payer: Medicare Other | Admitting: Neurology

## 2021-08-25 ENCOUNTER — Telehealth: Payer: Self-pay | Admitting: Neurology

## 2021-08-25 NOTE — Telephone Encounter (Addendum)
I spoke to the patient. He tried to lift a box of groceries two days ago while holding his cane. He fell over into some gravel and twisted his left ankle. One small scratch, mild bruising, swelling and limited ability to bear weight. Since the fall, he is not able to ambulate with just his cane. He is just sitting in his recliner and getting up only to go to the bathroom with a walker. His brother is with him in the evenings but he is alone during the day. He was unable to drive himself to his appt today. I instructed him to seek evaluation at urgent care for a physical exam of his ankle (may also need x-ray). His brother gets home at [place] and will be able to take him. In the meantime, he should prop his leg up and intermittently apply ice (10 minutes on and off, being careful with the integrity of his skin).  ? ?Additionally he is experiencing more intense pins/needles sensations in his bilateral lower extremities and feet. This is also contributing to him being unsteady. He had planned to discuss it at today's appointment. Currently taking the following meds: ?1) gabapentin '300mg'$ , one cap TID ?2) duloxetine '60mg'$ , one cap QD ? ?We will send to Dr. Krista Blue for review of any treatment adjustments, if appropriate , to help until he is seen on 09/22/21. ?

## 2021-08-25 NOTE — Telephone Encounter (Signed)
Pt has called to cx his appointment for today due to not being able to walk, he accepted next available appointment.  Please call ?

## 2021-08-25 NOTE — Telephone Encounter (Signed)
I spoke to the patient again. He is agreeable to this plan and will keep his follow up on 09/22/21. He plans to go to urgent care today to have his left ankle further evaluated. ?

## 2021-08-25 NOTE — Telephone Encounter (Signed)
He never had MRI of lumbar and thoracic spine that I ordered following his last visit on October 21, 2020, ? ?Current medicine dosage is okay, I will reevaluate him in May follow-up visit, repeat imaging study if needed ?

## 2021-09-22 ENCOUNTER — Ambulatory Visit (INDEPENDENT_AMBULATORY_CARE_PROVIDER_SITE_OTHER): Payer: Medicare Other | Admitting: Neurology

## 2021-09-22 ENCOUNTER — Encounter: Payer: Self-pay | Admitting: Neurology

## 2021-09-22 ENCOUNTER — Telehealth: Payer: Self-pay | Admitting: Neurology

## 2021-09-22 VITALS — BP 161/96 | HR 89 | Ht 67.0 in | Wt 269.0 lb

## 2021-09-22 DIAGNOSIS — M792 Neuralgia and neuritis, unspecified: Secondary | ICD-10-CM

## 2021-09-22 DIAGNOSIS — R269 Unspecified abnormalities of gait and mobility: Secondary | ICD-10-CM | POA: Diagnosis not present

## 2021-09-22 DIAGNOSIS — M4804 Spinal stenosis, thoracic region: Secondary | ICD-10-CM | POA: Diagnosis not present

## 2021-09-22 MED ORDER — DULOXETINE HCL 60 MG PO CPEP: 60.0000 mg | ORAL_CAPSULE | Freq: Every day | ORAL | 3 refills | Status: DC

## 2021-09-22 MED ORDER — AMLODIPINE BESYLATE 10 MG PO TABS: 10.0000 mg | ORAL_TABLET | Freq: Every day | ORAL | 4 refills | Status: DC

## 2021-09-22 MED ORDER — GABAPENTIN 300 MG PO CAPS: 300.0000 mg | ORAL_CAPSULE | Freq: Three times a day (TID) | ORAL | 3 refills | Status: DC

## 2021-09-22 NOTE — Telephone Encounter (Signed)
[employer] Medicare/[place] Medicaid NPR sent to GI they will call the patient to schedule

## 2021-09-22 NOTE — Patient Instructions (Signed)
Meds ordered this encounter  Medications   amLODipine (NORVASC) 10 MG tablet    Sig: Take 1 tablet (10 mg total) by mouth daily.    Dispense:  90 tablet    Refill:  4   DULoxetine (CYMBALTA) 60 MG capsule    Sig: Take 1 capsule (60 mg total) by mouth daily.    Dispense:  90 capsule    Refill:  3   gabapentin (NEURONTIN) 300 MG capsule    Sig: Take 1 capsule (300 mg total) by mouth 3 (three) times daily.    Dispense:  270 capsule    Refill:  3

## 2021-09-22 NOTE — Progress Notes (Signed)
ASSESSMENT AND PLAN  Luis Clark is a 71 y.o. 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 Christella Noa on Sep 06 2020. Continue to have gait abnormality, severe left knee extension, adduction, ankle dorsiflexion, weakness, absent left knee reflex, suggestive of left L2,, 3, 4 radiculopathy We will reorder MRI of lumbar  Neuropathic pain Refill Cymbalta 60 mg daily, gabapentin 300 mg 3 times daily  Right frontal fibrosis dysplasia -CT head August 2021 ordered by Dr. 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  HISTORICAL  Luis Clark, is a 71 years old male seen in request by his orthopedic surgeon Dr. Gladstone Lighter for evaluation of left leg weakness, numbness, initial evaluation is on November 17, 2019, he is accompanied by his significant others at today's visit.  I reviewed and summarized the referring note.  Past medical history of Hypertension, taking Norvasc 5 mg daily Hyperlipidemia, Lipitor 20 mg daily Acid reflux, omeprazole 20 mg daily  He is a poor historian, reported lifelong history of left side difficulty, he was enlisted in the [employer], was told he his left leg is shorter, he had no trouble walking, but he did reported with prolonged running, he would notice difficulty, his friend who has known him for 20 years, noted that he has a limp gait for many years.  His left side difficulty gradually getting worse, especially since last 5 years, he noticed increased left leg numbness weakness, numbness involving whole left leg from hip, left leg weakness, difficulty picking up against gravity.,  He also noticed worsening left arm weakness, numbness, he denies speech difficulty, no dysphagia, no bowel and bladder  incontinence,  Personally reviewed MRI of lumbar spine [facility] on December 27, 2019, multilevel lumbar degenerative changes 311 2 L1 partial demonstration of bilateral posterolateral and the fracture foraminal disc protrusion with squatting effacement probable significant foraminal stenosis bilaterally, L4-5 moderate central and left foraminal stenosis, variable degree of foraminal stenosis at the other levels  UPDATE Apr 13 2020: I personally reviewed MRI of the brain without contrast on December 04, 2019, right frontal heterogeneous calvarial lesion, expanding the diploic space, mass-effect upon the right inferior frontal lobe, right orbit, right frontal sinus, no abnormal signal within the brain parenchyma  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 Christella Noa on Sep 06 2020.  Surgery did help him walking better, spend more time up on his feet, but still has dense bilateral lower extremity numbness, left worse than right, especially when bearing weight, significant gait abnormalities, especially He denies bowel bladder incontinence  UPDATE Sep 22 2021: He drove himself to clinic today, he lives  with his  brother in [place], he missed his previous MRI of thoracic and lumbar skin due to transportation issues, he spent most of the time sitting down, complains of intermittent lower extremity stinging pain, gait abnormality, progress from walker to 1 point cane now,  He also complains of difficulty initiating urine, occasionally incontinence, denies bowel incontinence,  He has not seen his primary care physician for more than 2 years, today blood pressure is elevated, 161/96, he is on only low-dose amlodipine 5 mg, may increase to 10 mg daily,  For his lower extremity neuropathic pain he is taking Cymbalta 60 mg, gabapentin 300 mg 3 times a day, he is not taking his medicine consistently,   PHYSICAL EXAM   Vitals:   09/22/21 1023  BP: (!) 161/96  Pulse: 89  Weight: 269 lb (122 kg)  Height: '5\' 7"'$  (1.702 m)   Body mass index is 42.13 kg/m.  PHYSICAL EXAMNIATION:  Gen: NAD, conversant, well nourised, well groomed, obese, sleepy               NEUROLOGICAL EXAM:  MENTAL STATUS: Speech/Cognition: Awake, alert, oriented to history taking and casual conversation  CRANIAL NERVES: CN II: Visual fields are full to confrontation. Pupils are round equal and briskly reactive to light. CN III, IV, VI: extraocular movement are normal. No ptosis. CN V: Facial sensation is intact to light touch CN VII: Face is symmetric with normal eye closure  CN VIII: Hearing is normal to causal conversation. CN IX, X: Phonation is normal. CN XI: Head turning and shoulder shrug are intact  MOTOR: UE Shoulder Abduction Shoulder External Rotation Elbow Flexion Elbow  Extension Pronation Supination Wrist Flexion Wrist Extension Grip Finger  Abduction Finger Flexion /Extension  R '4 5 5 5 5 5 5 5 5 5 '$ 5/5  L '4 4 5 4 5 5 5 5 5 5 '$ 5/5   LE Hip Flexion Knee flexion Knee extension Ankle Dorsiflexion  R '5 5 5 5  '$ L '3 4 3 3    '$ REFLEXES: Symmetric bilateral upper extremity, Knee R/L 3/0, ankle  0/0  SENSORY: Decreased sensation to soft touch at the left lower extremity from thigh to toe  COORDINATION: Difficulty lifting the left leg for heel-to-shin  GAIT/STANCE: He needs to push-up to get up from seated position, dragging left leg, difficulty locking his left knee, rely on his cane  REVIEW OF SYSTEMS: Full 14 system review of systems performed and notable only for as above  See HPI  ALLERGIES: Allergies  Allergen Reactions   Penicillin G Swelling   Penicillins Swelling    Inflammation 71 years old     HOME MEDICATIONS: Current Outpatient Medications  Medication Sig Dispense Refill   amLODipine (NORVASC) 5 MG tablet Take 5 mg by mouth daily.     atorvastatin (LIPITOR) 20 MG tablet Take 20 mg by mouth daily.     DULoxetine (CYMBALTA) 60 MG capsule Take 1 capsule (60 mg total) by mouth daily. 90 capsule 3   gabapentin (NEURONTIN) 300 MG capsule Take 1 capsule (300 mg total) by mouth 3 (three) times daily. 270 capsule 3   LUMIGAN 0.01 % SOLN Place 1 drop into both eyes at bedtime.      Multiple Vitamin (MULTIVITAMIN) tablet Take 1 tablet by mouth daily.     omeprazole (PRILOSEC) 20 MG capsule Take 20 mg by mouth daily.     tamsulosin (FLOMAX) 0.4 MG CAPS capsule Take 0.4 mg by mouth daily.  tiZANidine (ZANAFLEX) 4 MG tablet Take 1 tablet (4 mg total) by mouth every 6 (six) hours as needed for muscle spasms. 60 tablet 0   VIAGRA 50 MG tablet Take 50 mg by mouth daily as needed for erectile dysfunction.     No current facility-administered medications for this visit.    PAST MEDICAL HISTORY: Past Medical History:  Diagnosis Date   Cataract    Enlarged prostate    GERD (gastroesophageal reflux disease)    HLD (hyperlipidemia)    Hypertension     PAST SURGICAL HISTORY: none FAMILY HISTORY: History reviewed. No pertinent family history.  SOCIAL HISTORY: Social History   Socioeconomic History   Marital status: Legally Separated    Spouse name: Not on  file   Number of children: Not on file   Years of education: Not on file   Highest education level: Not on file  Occupational History   Not on file  Tobacco Use   Smoking status: Former    Years: 40.00    Types: Cigarettes   Smokeless tobacco: Never  Vaping Use   Vaping Use: Never used  Substance and Sexual Activity   Alcohol use: Yes    Alcohol/week: 3.0 - 4.0 standard drinks    Types: 3 - 4 Cans of beer per week   Drug use: Not Currently   Sexual activity: Not on file  Other Topics Concern   Not on file  Social History Narrative   Not on file   Social Determinants of Health   Financial Resource Strain: Not on file  Food Insecurity: Not on file  Transportation Needs: Not on file  Physical Activity: Not on file  Stress: Not on file  Social Connections: Not on file  Intimate Partner Violence: Not on file   Marcial Pacas, M.D. Ph.D.  [facility] [place], [place] [address] Phone: [phone] Fax:      [phone]

## 2021-10-05 ENCOUNTER — Inpatient Hospital Stay: Admission: RE | Admit: 2021-10-05 | Payer: Medicare Other | Source: Ambulatory Visit

## 2021-10-05 ENCOUNTER — Ambulatory Visit: Admission: RE | Admit: 2021-10-05 | Payer: Medicare Other | Source: Home / Self Care | Admitting: Neurology

## 2021-10-10 NOTE — Progress Notes (Unsigned)
   There were no vitals taken for this visit.   Subjective:    Patient ID: Luis Clark, male    DOB: 03/17/1951, 71 y.o.   MRN: [id number]  HPI: Luis Clark is a 71 y.o. male  No chief complaint on file.  Patient presents to clinic to establish care with new PCP.  Introduced to Designer, jewellery role and practice setting.  All questions answered.  Discussed provider/patient relationship and expectations.  Patient reports a history of ***. Patient denies a history of: Hypertension, Elevated Cholesterol, Diabetes, Thyroid problems, Depression, Anxiety, Neurological problems, and Abdominal problems.   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/14/2020   Thoracic stenosis 04/14/2020   Left arm weakness 04/14/2020   Thoracic spinal stenosis 09/09/2020   Gait abnormality 10/21/2020   Neuropathic pain 10/21/2020   Chronic left shoulder pain 10/21/2020   Resolved Ambulatory Problems    Diagnosis Date Noted   No Resolved Ambulatory Problems   Past Medical History:  Diagnosis Date   Cataract    Enlarged prostate    GERD (gastroesophageal reflux disease)    HLD (hyperlipidemia)    Hypertension    Past Surgical History:  Procedure Laterality Date   KNEE SURGERY     LUMBAR LAMINECTOMY/DECOMPRESSION MICRODISCECTOMY N/A 09/09/2020   Procedure: Thoracic eleven-twelve Laminectomy;  Surgeon: Ashok Pall, MD;  Location: [place];  Service: Neurosurgery;  Laterality: N/A;   No family history on file.   Review of Systems  Per HPI unless specifically indicated above     Objective:    There were no vitals taken for this visit.  Wt Readings from Last 3 Encounters:  09/22/21 269 lb (122 kg)  10/21/20 253 lb 8 oz (115 kg)  09/09/20 245 lb (111.1 kg)    Physical  Exam  Results for orders placed or performed in visit on 05/31/21  HM HEPATITIS C SCREENING LAB  Result Value Ref Range   HM Hepatitis Screen Negative-Validated       Assessment & Plan:   Problem List Items Addressed This Visit       Nervous and Auditory   Left hemiparesis (Luis Clark) - Primary     Follow up plan: No follow-ups on file.

## 2021-10-11 ENCOUNTER — Ambulatory Visit (INDEPENDENT_AMBULATORY_CARE_PROVIDER_SITE_OTHER): Payer: Medicare Other | Admitting: Nurse Practitioner

## 2021-10-11 ENCOUNTER — Encounter: Payer: Self-pay | Admitting: Nurse Practitioner

## 2021-10-11 VITALS — BP 136/85 | HR 88 | Temp 97.8°F | Ht 68.11 in | Wt 265.6 lb

## 2021-10-11 DIAGNOSIS — G8194 Hemiplegia, unspecified affecting left nondominant side: Secondary | ICD-10-CM | POA: Diagnosis not present

## 2021-10-11 DIAGNOSIS — M792 Neuralgia and neuritis, unspecified: Secondary | ICD-10-CM | POA: Diagnosis not present

## 2021-10-11 DIAGNOSIS — E782 Mixed hyperlipidemia: Secondary | ICD-10-CM | POA: Diagnosis not present

## 2021-10-11 DIAGNOSIS — E785 Hyperlipidemia, unspecified: Secondary | ICD-10-CM | POA: Insufficient documentation

## 2021-10-11 DIAGNOSIS — I10 Essential (primary) hypertension: Secondary | ICD-10-CM | POA: Insufficient documentation

## 2021-10-11 MED ORDER — GABAPENTIN 400 MG PO CAPS: 400.0000 mg | ORAL_CAPSULE | Freq: Three times a day (TID) | ORAL | 1 refills | Status: DC

## 2021-10-11 NOTE — Assessment & Plan Note (Signed)
Chronic.  Controlled.  Continue with current medication regimen on Amlodipine daily.  Return to clinic in 1 months for reevaluation.  Will draw labs at next visit.  Call sooner if concerns arise.

## 2021-10-11 NOTE — Assessment & Plan Note (Signed)
Chronic.  Controlled.  Continue with current medication regimen of Atorvastatin.  Will check labs at next visit.   Return to clinic in 1 months for reevaluation.  Call sooner if concerns arise.

## 2021-10-11 NOTE — Assessment & Plan Note (Signed)
Has a history of Throacic abscess and after surgery has had Left sided weakness and nerve pain.  Continue with exercises at home to help with strength.

## 2021-10-11 NOTE — Assessment & Plan Note (Addendum)
Chronic. Feels like the Gabapentin could help a little more.  Will increase dose of Gabapentin to '400mg'$  TID. Follow up in 1 month for reevaluation.

## 2021-10-17 ENCOUNTER — Ambulatory Visit
Admission: RE | Admit: 2021-10-17 | Discharge: 2021-10-17 | Disposition: A | Discharge status: Home / Self Care | Payer: Medicare Other | Source: Ambulatory Visit | Attending: Neurology | Admitting: Neurology

## 2021-10-17 DIAGNOSIS — R269 Unspecified abnormalities of gait and mobility: Secondary | ICD-10-CM

## 2021-10-17 DIAGNOSIS — M4804 Spinal stenosis, thoracic region: Secondary | ICD-10-CM

## 2021-10-17 DIAGNOSIS — M792 Neuralgia and neuritis, unspecified: Secondary | ICD-10-CM

## 2021-10-17 DIAGNOSIS — M5126 Other intervertebral disc displacement, lumbar region: Secondary | ICD-10-CM | POA: Diagnosis not present

## 2021-10-17 DIAGNOSIS — M545 Low back pain, unspecified: Secondary | ICD-10-CM | POA: Diagnosis not present

## 2021-10-17 DIAGNOSIS — R200 Anesthesia of skin: Secondary | ICD-10-CM | POA: Diagnosis not present

## 2021-10-17 DIAGNOSIS — M47816 Spondylosis without myelopathy or radiculopathy, lumbar region: Secondary | ICD-10-CM | POA: Diagnosis not present

## 2021-10-17 IMAGING — MR MR LUMBAR SPINE W/O CM
4 of 5 series · 18 of 48 positions shown · non-contrast
Comparison: No prior lumbar spine MRI, correlation is made with
lumbar spine radiographs [DATE]

CLINICAL DATA: Low back pain, bilateral leg pain, numbness in left
leg

EXAM:
MRI LUMBAR SPINE WITHOUT CONTRAST
TECHNIQUE: Multiplanar, multisequence MR imaging of the lumbar spine was
performed. No intravenous contrast was administered.

[Series 6: T2 · sagittal · 4.0mm · 0.73mm/px · 5 of 15 slices shown (1 of 2)]
[im 1/15]
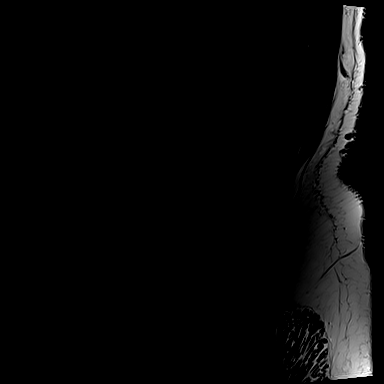
[im 4/15]
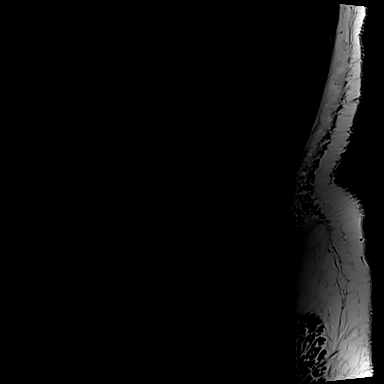
[im 8/15]
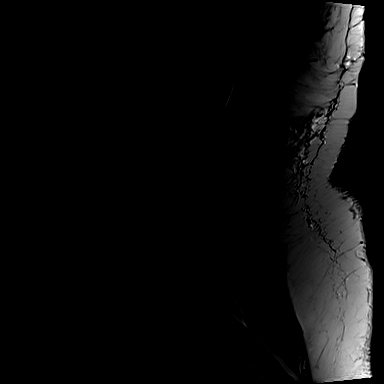
[im 11/15]
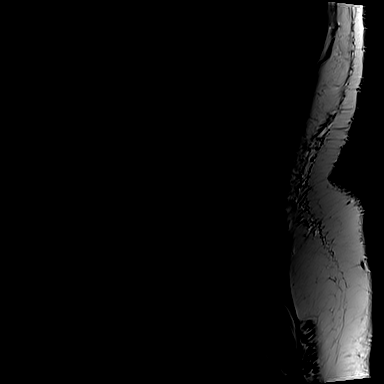
[im 15/15]
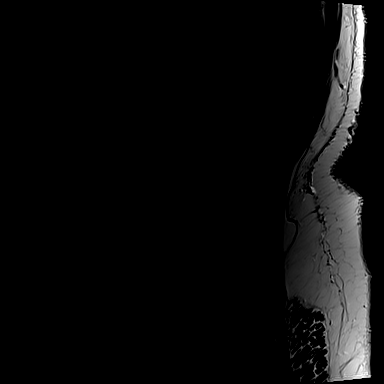

[Series 7: T1 · sagittal · 4.0mm · 0.73mm/px · 3 of 15 slices shown (1 of 2)]
[im 1/15]
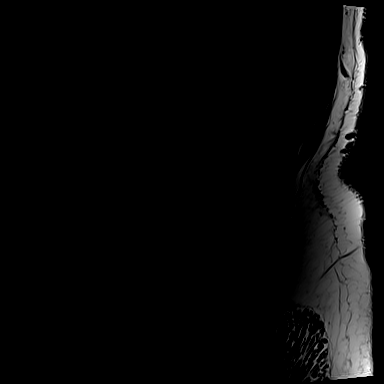
[im 8/15]
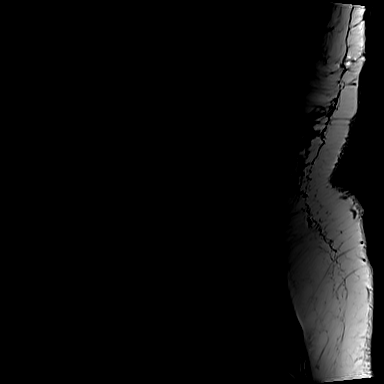
[im 15/15]
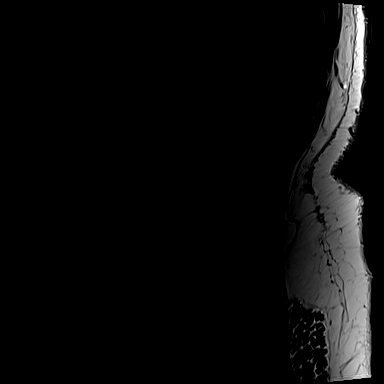

[Series 11: T1 · axial · 4.0mm · 0.28mm/px · z∈[-36,+141]mm · 3 of 42 slices shown (2 of 2)]
[im 6/42]
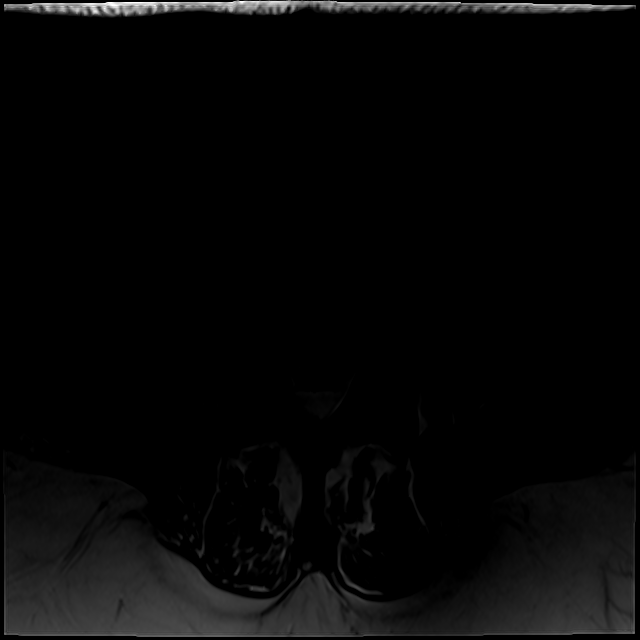
[im 22/42]
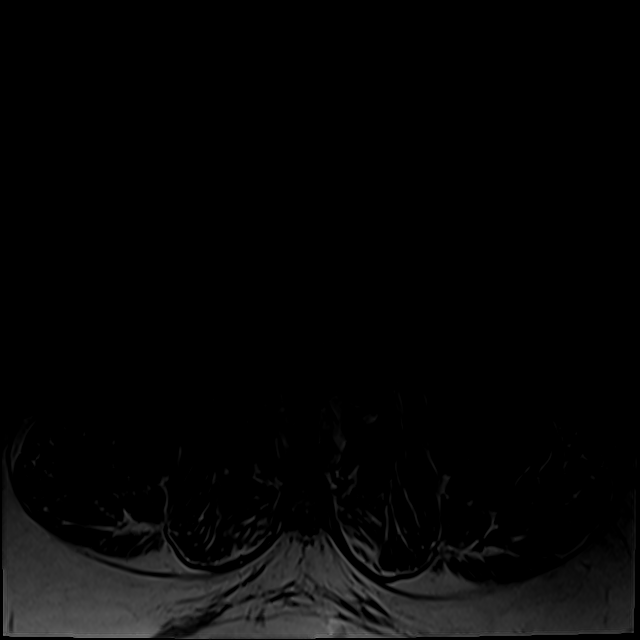
[im 36/42]
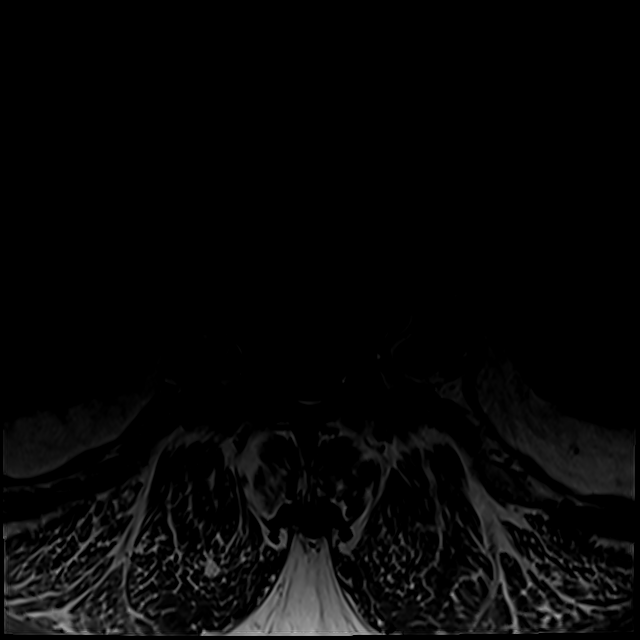

[Series 14: T2 · axial · 4.0mm · 0.28mm/px · z∈[-50,+141]mm · 7 of 42 slices shown (2 of 2)]
[im 3/42]
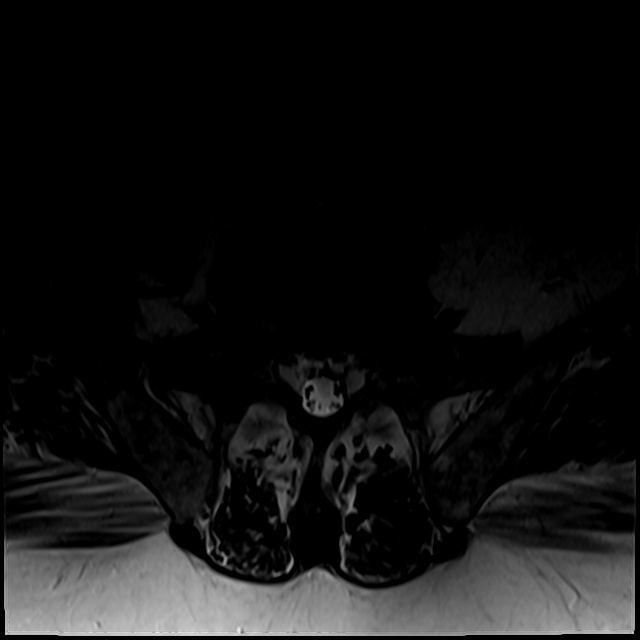
[im 6/42]
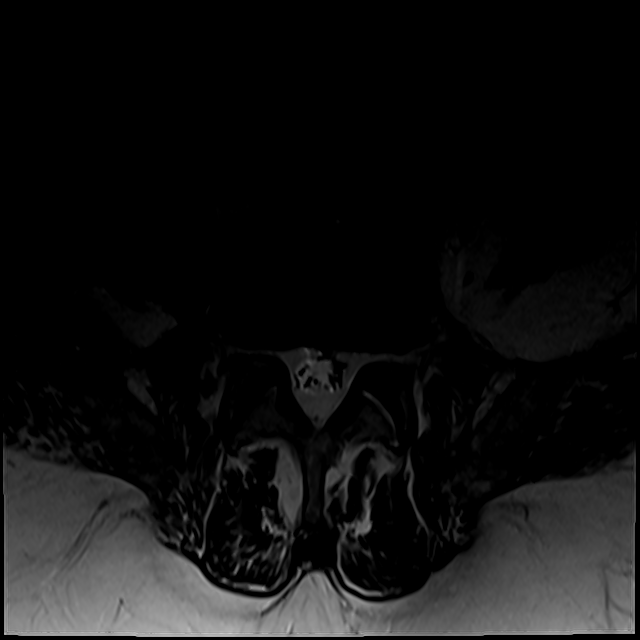
[im 9/42]
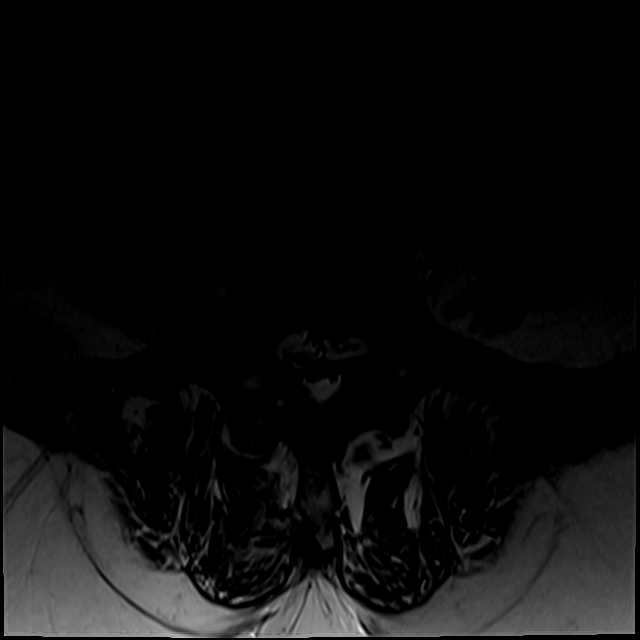
[im 14/42]
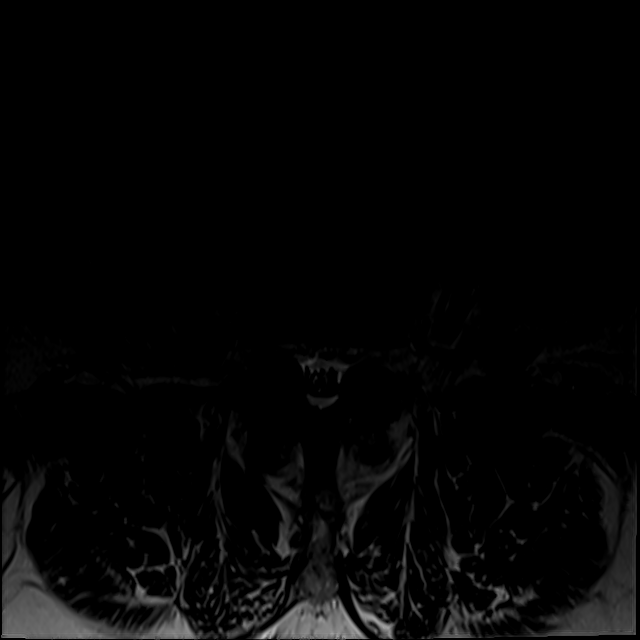
[im 20/42]
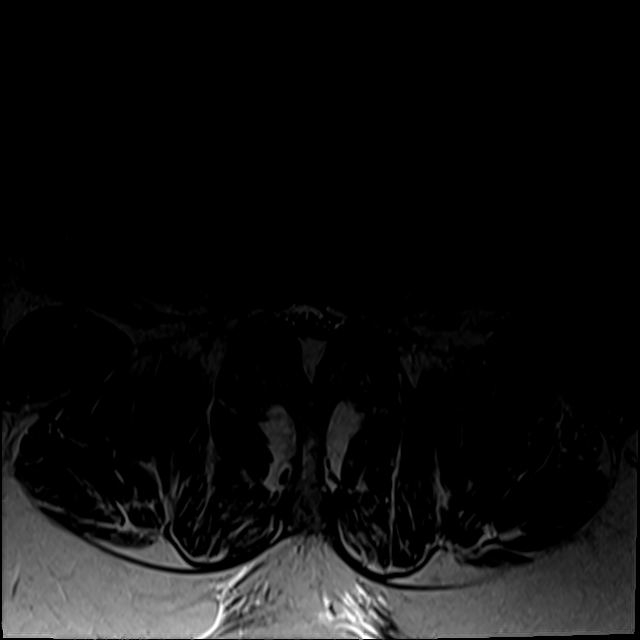
[im 22/42]
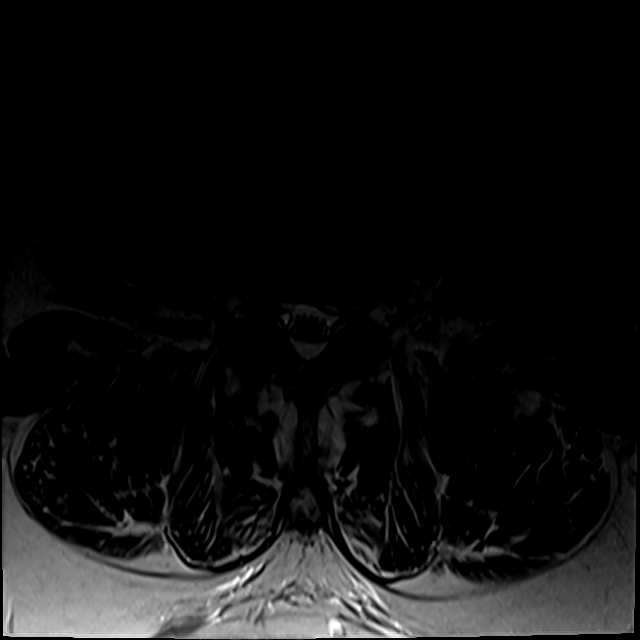
[im 36/42]
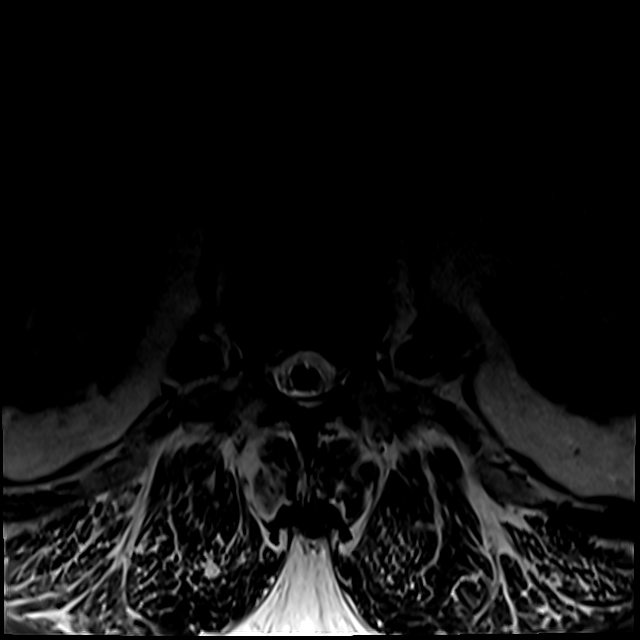

[18 of 48 positions shown; findings below may reference images not displayed]

FINDINGS: Segmentation:  Standard.

Alignment:  Physiologic.

Vertebrae:  No acute fracture or suspicious osseous lesion.

Conus medullaris and cauda equina: Conus extends to the L2 level.
Conus and cauda equina appear normal.

Paraspinal and other soft tissues: No acute finding.

Disc levels:

T12-L1: Mild disc bulge. Mild facet arthropathy. No spinal canal
stenosis no neural foraminal narrowing.

L1-L2: Minimal disc bulge. Mild facet arthropathy. No spinal canal
stenosis no neural foraminal narrowing.

L2-L3: Mild disc bulge. Mild facet arthropathy. No spinal canal
stenosis no neural foraminal narrowing.

L3-L4: Mild disc bulge. Mild facet arthropathy. Mild thecal sac
narrowing, in part secondary to epidural lipomatosis. No neural
foraminal narrowing.

L4-L5: Mild disc bulge. Mild facet arthropathy.
Right-greater-than-left ligamentum flavum hypertrophy. At the disc
level, there is no spinal canal stenosis; however between the L4-L5
and L5-S1 disc level, there is moderate to severe thecal sac
narrowing secondary to epidural lipomatosis (series 14, image 33 and
34). No neural foraminal narrowing.

L5-S1: Mild disc bulge. Mild facet arthropathy. Mild thecal sac
narrowing, predominantly secondary to epidural lipomatosis. Mild
right neural foraminal narrowing.
IMPRESSION: 1. Epidural lipomatosis, which causes moderate to severe thecal sac
narrowing between the L4-L5 and L5-S1 disc spaces and mild thecal
sac narrowing at L5-S1. It also contributes to mild thecal sac
narrowing at L3-L4.
2. L5-S1 mild right neural foraminal narrowing.

## 2021-10-18 ENCOUNTER — Telehealth: Payer: Self-pay | Admitting: Neurology

## 2021-10-18 DIAGNOSIS — R269 Unspecified abnormalities of gait and mobility: Secondary | ICD-10-CM

## 2021-10-18 DIAGNOSIS — M792 Neuralgia and neuritis, unspecified: Secondary | ICD-10-CM

## 2021-10-18 NOTE — Telephone Encounter (Signed)
Please call patient MRI lumbar spine showed epidural lipomatosis with degenerative changes cause severe spinal stenosis adjacent to L5 vertebral body, potential compressed nerve root,  I have ordered EMG nerve conduction study for further evaluation, we will review MRI scan   IMPRESSION: This MRI of the lumbar spine without contrast shows the following: 1.   There is epidural lipomatosis combined with degenerative change causing severe spinal stenosis adjacent to the L5 vertebral body.  There is potential to compress the nerve roots traversing this region.. 2.   There is milder spinal stenosis at L3-L4 and L5-S1 due to increased epidural fat and other degenerative changes not meeting to nerve root compression. 3.   Degenerative changes at other levels do not lead to spinal stenosis or nerve root compression.Marland Kitchen

## 2021-10-19 NOTE — Telephone Encounter (Signed)
I contacted the pt and we discussed results of MRI and the option to complete the EMG/NCS.   He is agreeable and transferred to checkout for scheduling.

## 2021-10-22 ENCOUNTER — Other Ambulatory Visit: Payer: Self-pay | Admitting: Neurology

## 2021-10-25 DIAGNOSIS — H2513 Age-related nuclear cataract, bilateral: Secondary | ICD-10-CM | POA: Diagnosis not present

## 2021-10-25 DIAGNOSIS — H0102A Squamous blepharitis right eye, upper and lower eyelids: Secondary | ICD-10-CM | POA: Diagnosis not present

## 2021-10-25 DIAGNOSIS — H40033 Anatomical narrow angle, bilateral: Secondary | ICD-10-CM | POA: Diagnosis not present

## 2021-10-25 DIAGNOSIS — H0102B Squamous blepharitis left eye, upper and lower eyelids: Secondary | ICD-10-CM | POA: Diagnosis not present

## 2021-10-25 DIAGNOSIS — H43393 Other vitreous opacities, bilateral: Secondary | ICD-10-CM | POA: Diagnosis not present

## 2021-10-25 DIAGNOSIS — H401131 Primary open-angle glaucoma, bilateral, mild stage: Secondary | ICD-10-CM | POA: Diagnosis not present

## 2021-10-27 ENCOUNTER — Ambulatory Visit (INDEPENDENT_AMBULATORY_CARE_PROVIDER_SITE_OTHER): Payer: Medicare Other | Admitting: *Deleted

## 2021-10-27 ENCOUNTER — Telehealth: Payer: Self-pay

## 2021-10-27 ENCOUNTER — Other Ambulatory Visit: Payer: Self-pay

## 2021-10-27 DIAGNOSIS — Z1211 Encounter for screening for malignant neoplasm of colon: Secondary | ICD-10-CM

## 2021-10-27 DIAGNOSIS — Z0000 Encounter for general adult medical examination without abnormal findings: Secondary | ICD-10-CM

## 2021-10-27 MED ORDER — NA SULFATE-K SULFATE-MG SULF 17.5-3.13-1.6 GM/177ML PO SOLN: 1.0000 | Freq: Once | ORAL | 0 refills | Status: AC

## 2021-10-27 NOTE — Telephone Encounter (Signed)
Gastroenterology Pre-Procedure Review  Request Date: 11/16/21 Requesting Physician: Dr. Vicente Males  PATIENT REVIEW QUESTIONS: The patient responded to the following health history questions as indicated:    1. Are you having any GI issues? no 2. Do you have a personal history of Polyps? yes [facility] 2010) 3. Do you have a family history of Colon Cancer or Polyps? unsure 4. Diabetes Mellitus? no 5. Joint replacements in the past 12 months? 09/09/2020 back surgery at [facility] 6. Major health problems in the past 3 months?no 7. Any artificial heart valves, MVP, or defibrillator?no    MEDICATIONS & ALLERGIES:    Patient reports the following regarding taking any anticoagulation/antiplatelet therapy:   Plavix, Coumadin, Eliquis, Xarelto, Lovenox, Pradaxa, Brilinta, or Effient? no Aspirin? no  Patient confirms/reports the following medications:  Current Outpatient Medications  Medication Sig Dispense Refill   amLODipine (NORVASC) 10 MG tablet Take 1 tablet (10 mg total) by mouth daily. 90 tablet 4   atorvastatin (LIPITOR) 20 MG tablet Take 20 mg by mouth daily.     DULoxetine (CYMBALTA) 60 MG capsule Take 1 capsule (60 mg total) by mouth daily. 90 capsule 3   gabapentin (NEURONTIN) 400 MG capsule Take 1 capsule (400 mg total) by mouth 3 (three) times daily. 270 capsule 1   LUMIGAN 0.01 % SOLN Place 1 drop into both eyes at bedtime.      Multiple Vitamin (MULTIVITAMIN) tablet Take 1 tablet by mouth daily.     omeprazole (PRILOSEC) 20 MG capsule Take 20 mg by mouth daily.     tamsulosin (FLOMAX) 0.4 MG CAPS capsule Take 0.4 mg by mouth daily.     tiZANidine (ZANAFLEX) 4 MG tablet Take 1 tablet (4 mg total) by mouth every 6 (six) hours as needed for muscle spasms. 60 tablet 0   VIAGRA 50 MG tablet Take 50 mg by mouth daily as needed for erectile dysfunction.     No current facility-administered medications for this visit.    Patient confirms/reports the following allergies:   Allergies  Allergen Reactions   Penicillin G Swelling   Penicillins Swelling    Inflammation 71 years old     No orders of the defined types were placed in this encounter.   AUTHORIZATION INFORMATION Primary Insurance: 1D#: Group #:  Secondary Insurance: 1D#: Group #:  SCHEDULE INFORMATION: Date: 11/16/21 Time: Location: [facility]

## 2021-10-27 NOTE — Patient Instructions (Signed)
Luis Clark , Thank you for taking time to come for your Medicare Wellness Visit. I appreciate your ongoing commitment to your health goals. Please review the following plan we discussed and let me know if I can assist you in the future.   Screening recommendations/referrals: Colonoscopy: Education provided Recommended yearly ophthalmology/optometry visit for glaucoma screening and checkup Recommended yearly dental visit for hygiene and checkup  Vaccinations: Influenza vaccine: Education provided Pneumococcal vaccine: Education provided Tdap vaccine: Education provided Shingles vaccine: Education provided    Advanced directives: on file  Conditions/risks identified:   Next appointment: [phone] 8:40  [facility]  Preventive Care 26 Years and Older, Male Preventive care refers to lifestyle choices and visits with your health care provider that can promote health and wellness. What does preventive care include? A yearly physical exam. This is also called an annual well check. Dental exams once or twice a year. Routine eye exams. Ask your health care provider how often you should have your eyes checked. Personal lifestyle choices, including: Daily care of your teeth and gums. Regular physical activity. Eating a healthy diet. Avoiding tobacco and drug use. Limiting alcohol use. Practicing safe sex. Taking low doses of aspirin every day. Taking vitamin and mineral supplements as recommended by your health care provider. What happens during an annual well check? The services and screenings done by your health care provider during your annual well check will depend on your age, overall health, lifestyle risk factors, and family history of disease. Counseling  Your health care provider may ask you questions about your: Alcohol use. Tobacco use. Drug use. Emotional well-being. Home and relationship well-being. Sexual activity. Eating habits. History of falls. Memory and ability  to understand (cognition). Work and work Statistician. Screening  You may have the following tests or measurements: Height, weight, and BMI. Blood pressure. Lipid and cholesterol levels. These may be checked every 5 years, or more frequently if you are over 39 years old. Skin check. Lung cancer screening. You may have this screening every year starting at age 45 if you have a 30-pack-year history of smoking and currently smoke or have quit within the past 15 years. Fecal occult blood test (FOBT) of the stool. You may have this test every year starting at age 54. Flexible sigmoidoscopy or colonoscopy. You may have a sigmoidoscopy every 5 years or a colonoscopy every 10 years starting at age 38. Prostate cancer screening. Recommendations will vary depending on your family history and other risks. Hepatitis C blood test. Hepatitis B blood test. Sexually transmitted disease (STD) testing. Diabetes screening. This is done by checking your blood sugar (glucose) after you have not eaten for a while (fasting). You may have this done every 1-3 years. Abdominal aortic aneurysm (AAA) screening. You may need this if you are a current or former smoker. Osteoporosis. You may be screened starting at age 33 if you are at high risk. Talk with your health care provider about your test results, treatment options, and if necessary, the need for more tests. Vaccines  Your health care provider may recommend certain vaccines, such as: Influenza vaccine. This is recommended every year. Tetanus, diphtheria, and acellular pertussis (Tdap, Td) vaccine. You may need a Td booster every 10 years. Zoster vaccine. You may need this after age 64. Pneumococcal 13-valent conjugate (PCV13) vaccine. One dose is recommended after age 5. Pneumococcal polysaccharide (PPSV23) vaccine. One dose is recommended after age 13. Talk to your health care provider about which screenings and vaccines you need and how  often you need  them. This information is not intended to replace advice given to you by your health care provider. Make sure you discuss any questions you have with your health care provider. Document Released: 05/21/2015 Document Revised: 01/12/2016 Document Reviewed: 02/23/2015 Elsevier Interactive Patient Education  2017 [place] Prevention in the Home Falls can cause injuries. They can happen to people of all ages. There are many things you can do to make your home safe and to help prevent falls. What can I do on the outside of my home? Regularly fix the edges of walkways and driveways and fix any cracks. Remove anything that might make you trip as you walk through a door, such as a raised step or threshold. Trim any bushes or trees on the path to your home. Use bright outdoor lighting. Clear any walking paths of anything that might make someone trip, such as rocks or tools. Regularly check to see if handrails are loose or broken. Make sure that both sides of any steps have handrails. Any raised decks and porches should have guardrails on the edges. Have any leaves, snow, or ice cleared regularly. Use sand or salt on walking paths during winter. Clean up any spills in your garage right away. This includes oil or grease spills. What can I do in the bathroom? Use night lights. Install grab bars by the toilet and in the tub and shower. Do not use towel bars as grab bars. Use non-skid mats or decals in the tub or shower. If you need to sit down in the shower, use a plastic, non-slip stool. Keep the floor dry. Clean up any water that spills on the floor as soon as it happens. Remove soap buildup in the tub or shower regularly. Attach bath mats securely with double-sided non-slip rug tape. Do not have throw rugs and other things on the floor that can make you trip. What can I do in the bedroom? Use night lights. Make sure that you have a light by your bed that is easy to reach. Do not use  any sheets or blankets that are too big for your bed. They should not hang down onto the floor. Have a firm chair that has side arms. You can use this for support while you get dressed. Do not have throw rugs and other things on the floor that can make you trip. What can I do in the kitchen? Clean up any spills right away. Avoid walking on wet floors. Keep items that you use a lot in easy-to-reach places. If you need to reach something above you, use a strong step stool that has a grab bar. Keep electrical cords out of the way. Do not use floor polish or wax that makes floors slippery. If you must use wax, use non-skid floor wax. Do not have throw rugs and other things on the floor that can make you trip. What can I do with my stairs? Do not leave any items on the stairs. Make sure that there are handrails on both sides of the stairs and use them. Fix handrails that are broken or loose. Make sure that handrails are as long as the stairways. Check any carpeting to make sure that it is firmly attached to the stairs. Fix any carpet that is loose or worn. Avoid having throw rugs at the top or bottom of the stairs. If you do have throw rugs, attach them to the floor with carpet tape. Make sure that you have a  light switch at the top of the stairs and the bottom of the stairs. If you do not have them, ask someone to add them for you. What else can I do to help prevent falls? Wear shoes that: Do not have high heels. Have rubber bottoms. Are comfortable and fit you well. Are closed at the toe. Do not wear sandals. If you use a stepladder: Make sure that it is fully opened. Do not climb a closed stepladder. Make sure that both sides of the stepladder are locked into place. Ask someone to hold it for you, if possible. Clearly mark and make sure that you can see: Any grab bars or handrails. First and last steps. Where the edge of each step is. Use tools that help you move around (mobility aids)  if they are needed. These include: Canes. Walkers. Scooters. Crutches. Turn on the lights when you go into a dark area. Replace any light bulbs as soon as they burn out. Set up your furniture so you have a clear path. Avoid moving your furniture around. If any of your floors are uneven, fix them. If there are any pets around you, be aware of where they are. Review your medicines with your doctor. Some medicines can make you feel dizzy. This can increase your chance of falling. Ask your doctor what other things that you can do to help prevent falls. This information is not intended to replace advice given to you by your health care provider. Make sure you discuss any questions you have with your health care provider. Document Released: 02/18/2009 Document Revised: 09/30/2015 Document Reviewed: 05/29/2014 Elsevier Interactive Patient Education  2017 [employer].

## 2021-11-14 ENCOUNTER — Telehealth: Payer: Self-pay

## 2021-11-14 NOTE — Telephone Encounter (Signed)
Returned phone call back to patient in regards to his request to reschedule his 11/16/21 colonoscopy procedure.  LVM for him to call office back to reschedule.  Thanks, [place], [place]

## 2021-11-15 ENCOUNTER — Encounter: Payer: Self-pay | Admitting: Gastroenterology

## 2021-11-16 ENCOUNTER — Ambulatory Visit: Payer: Medicare Other | Admitting: Certified Registered Nurse Anesthetist

## 2021-11-16 ENCOUNTER — Ambulatory Visit
Admission: RE | Admit: 2021-11-16 | Discharge: 2021-11-16 | Disposition: A | Discharge status: Home / Self Care | Payer: Medicare Other | Attending: Gastroenterology | Admitting: Gastroenterology

## 2021-11-16 ENCOUNTER — Encounter
Admission: RE | Disposition: A | Discharge status: Home / Self Care | Payer: Self-pay | Source: Home / Self Care | Attending: Gastroenterology

## 2021-11-16 ENCOUNTER — Encounter: Payer: Self-pay | Admitting: Gastroenterology

## 2021-11-16 DIAGNOSIS — D126 Benign neoplasm of colon, unspecified: Secondary | ICD-10-CM

## 2021-11-16 DIAGNOSIS — Z6841 Body Mass Index (BMI) 40.0 and over, adult: Secondary | ICD-10-CM | POA: Diagnosis not present

## 2021-11-16 DIAGNOSIS — Z1211 Encounter for screening for malignant neoplasm of colon: Secondary | ICD-10-CM | POA: Insufficient documentation

## 2021-11-16 DIAGNOSIS — Z87891 Personal history of nicotine dependence: Secondary | ICD-10-CM | POA: Diagnosis not present

## 2021-11-16 DIAGNOSIS — D124 Benign neoplasm of descending colon: Secondary | ICD-10-CM | POA: Insufficient documentation

## 2021-11-16 DIAGNOSIS — D122 Benign neoplasm of ascending colon: Secondary | ICD-10-CM | POA: Diagnosis not present

## 2021-11-16 DIAGNOSIS — I10 Essential (primary) hypertension: Secondary | ICD-10-CM | POA: Insufficient documentation

## 2021-11-16 DIAGNOSIS — K219 Gastro-esophageal reflux disease without esophagitis: Secondary | ICD-10-CM | POA: Insufficient documentation

## 2021-11-16 HISTORY — DX: Neuralgia and neuritis, unspecified: M79.2

## 2021-11-16 HISTORY — DX: Spinal stenosis, thoracic region: M48.04

## 2021-11-16 HISTORY — PX: COLONOSCOPY WITH PROPOFOL: SHX5780

## 2021-11-16 SURGERY — COLONOSCOPY WITH PROPOFOL
Anesthesia: General

## 2021-11-16 MED ORDER — LIDOCAINE HCL (CARDIAC) PF 100 MG/5ML IV SOSY
PREFILLED_SYRINGE | INTRAVENOUS | Status: DC | PRN
  Administered 2021-11-16: 100 mg via INTRAVENOUS

## 2021-11-16 MED ORDER — PROPOFOL 500 MG/50ML IV EMUL
INTRAVENOUS | Status: DC | PRN
  Administered 2021-11-16: 140 ug/kg/min via INTRAVENOUS

## 2021-11-16 MED ORDER — SODIUM CHLORIDE 0.9 % IV SOLN
INTRAVENOUS | Status: DC
  Administered 2021-11-16: 1000 mL via INTRAVENOUS

## 2021-11-16 MED ORDER — GLYCOPYRROLATE 0.2 MG/ML IJ SOLN
INTRAMUSCULAR | Status: DC | PRN
  Administered 2021-11-16: .2 mg via INTRAVENOUS

## 2021-11-16 MED ORDER — PROPOFOL 10 MG/ML IV BOLUS
INTRAVENOUS | Status: DC | PRN
  Administered 2021-11-16: 20 mg via INTRAVENOUS
  Administered 2021-11-16: 70 mg via INTRAVENOUS

## 2021-11-16 NOTE — Anesthesia Postprocedure Evaluation (Signed)
Anesthesia Post Note  Patient: Luis Clark  Procedure(s) Performed: COLONOSCOPY WITH PROPOFOL  Patient location during evaluation: Endoscopy Anesthesia Type: General Level of consciousness: awake and alert Pain management: pain level controlled Vital Signs Assessment: post-procedure vital signs reviewed and stable Respiratory status: spontaneous breathing, nonlabored ventilation, respiratory function stable and patient connected to nasal cannula oxygen Cardiovascular status: blood pressure returned to baseline and stable Postop Assessment: no apparent nausea or vomiting Anesthetic complications: no   No notable events documented.   Last Vitals:  Vitals:   11/16/21 1328 11/16/21 1338  BP: 131/88 136/90  Pulse: 72 79  Resp: 18 19  Temp:    SpO2: 97% 97%    Last Pain:  Vitals:   11/16/21 1338  TempSrc:   PainSc: 0-No pain                 Arita Miss

## 2021-11-16 NOTE — Anesthesia Procedure Notes (Signed)
Date/Time: 11/16/2021 12:59 PM  Performed by: Lily Peer, Gennifer Potenza, CRNAPre-anesthesia Checklist: Patient identified, Emergency Drugs available, Timeout performed, Patient being monitored and Suction available Patient Re-evaluated:Patient Re-evaluated prior to induction Oxygen Delivery Method: Nasal cannula Induction Type: IV induction

## 2021-11-16 NOTE — H&P (Signed)
Luis Bellows, MD [address] [place], [place], [place], [address] [address], [place], [place], [place], [address] Phone: [phone]  Fax: [phone]  Primary Care Physician:  Jon Billings, NP   Pre-Procedure History & Physical: HPI:  Luis Clark is a 71 y.o. male is here for an colonoscopy.   Past Medical History:  Diagnosis Date   Cataract    Enlarged prostate    GERD (gastroesophageal reflux disease)    HLD (hyperlipidemia)    Hypertension    Neuropathic pain    Thoracic stenosis     Past Surgical History:  Procedure Laterality Date   BACK SURGERY     KNEE SURGERY     LUMBAR LAMINECTOMY/DECOMPRESSION MICRODISCECTOMY N/A 09/09/2020   Procedure: Thoracic eleven-twelve Laminectomy;  Surgeon: Ashok Pall, MD;  Location: [facility];  Service: Neurosurgery;  Laterality: N/A;    Prior to Admission medications   Medication Sig Start Date End Date Taking? Authorizing Provider  amLODipine (NORVASC) 10 MG tablet Take 1 tablet (10 mg total) by mouth daily. 09/22/21  Yes Marcial Pacas, MD  atorvastatin (LIPITOR) 20 MG tablet Take 20 mg by mouth daily. 10/15/19  Yes [provider]  DULoxetine (CYMBALTA) 60 MG capsule Take 1 capsule (60 mg total) by mouth daily. 09/22/21  Yes Marcial Pacas, MD  gabapentin (NEURONTIN) 400 MG capsule Take 1 capsule (400 mg total) by mouth 3 (three) times daily. 10/11/21  Yes Jon Billings, NP  LUMIGAN 0.01 % SOLN Place 1 drop into both eyes at bedtime.  10/15/19  Yes [provider]  Multiple Vitamin (MULTIVITAMIN) tablet Take 1 tablet by mouth daily.   Yes [provider]  omeprazole (PRILOSEC) 20 MG capsule Take 20 mg by mouth daily. 10/15/19  Yes [provider]  tamsulosin (FLOMAX) 0.4 MG CAPS capsule Take 0.4 mg by mouth daily. 10/15/19  Yes [provider]  tiZANidine (ZANAFLEX) 4 MG tablet Take 1 tablet (4 mg total) by mouth every 6 (six) hours as needed for muscle spasms. 09/14/20  Yes Ashok Pall, MD  VIAGRA 50 MG tablet Take 50 mg by mouth daily as needed for erectile dysfunction. 08/11/20   [provider]    Allergies as of 10/27/2021 - Review Complete 10/27/2021  Allergen Reaction Noted   Penicillin g Swelling 04/13/2020   Penicillins Swelling 06/17/2016    History reviewed. No pertinent family history.  Social History   Socioeconomic History   Marital status: Legally Separated    Spouse name: Not on file   Number of children: Not on file   Years of education: Not on file   Highest education level: Not on file  Occupational History   Not on file  Tobacco Use   Smoking status: Former    Years: 40.00    Types: Cigarettes    Quit date: 2014    Years since quitting: 9.5   Smokeless tobacco: Never  Vaping Use   Vaping Use: Never used  Substance and Sexual Activity   Alcohol use: Yes    Alcohol/week: 3.0 - 4.0 standard drinks of alcohol    Types: 3 - 4 Cans of beer per week   Drug use: Not Currently   Sexual activity: Yes  Other Topics Concern   Not on file  Social History Narrative   Not on file   Social Determinants of Health   Financial Resource Strain: Low Risk  (10/27/2021)   Overall Financial Resource Strain (CARDIA)    Difficulty of  Paying Living Expenses: Not hard at all  Food Insecurity: No Food Insecurity (10/27/2021)   Hunger Vital Sign    Worried About Running Out of Food in the Last Year: Never true    Ran Out of Food in the Last Year: Never true  Transportation Needs: No Transportation Needs (10/27/2021)   PRAPARE - Hydrologist (Medical): No    Lack of Transportation (Non-Medical): No  Physical Activity: Insufficiently Active (10/27/2021)   Exercise Vital Sign    Days of Exercise per Week: 4 days    Minutes of Exercise per Session: 30 min  Stress: No Stress Concern Present (10/27/2021)   [place]    Feeling of Stress : Not at all   Social Connections: Moderately Integrated (10/27/2021)   Social Connection and Isolation Panel [NHANES]    Frequency of Communication with Friends and Family: Twice a week    Frequency of Social Gatherings with Friends and Family: Once a week    Attends Religious Services: More than 4 times per year    Active Member of [employer] or Organizations: Yes    Attends Archivist Meetings: More than 4 times per year    Marital Status: Separated  Intimate Partner Violence: Not At Risk (10/27/2021)   Humiliation, Afraid, Rape, and Kick questionnaire    Fear of Current or Ex-Partner: No    Emotionally Abused: No    Physically Abused: No    Sexually Abused: No    Review of Systems: See HPI, otherwise negative ROS  Physical Exam: BP (!) 143/92   Pulse 92   Temp 97.8 F (36.6 C) (Temporal)   Resp 18   Ht '5\' 7"'$  (1.702 m)   Wt 120.5 kg   SpO2 97%   BMI 41.59 kg/m  General:   Alert,  pleasant and cooperative in NAD Head:  Normocephalic and atraumatic. Neck:  Supple; no masses or thyromegaly. Lungs:  Clear throughout to auscultation, normal respiratory effort.    Heart:  +S1, +S2, Regular rate and rhythm, No edema. Abdomen:  Soft, nontender and nondistended. Normal bowel sounds, without guarding, and without rebound.   Neurologic:  Alert and  oriented x4;  grossly normal neurologically.  Impression/Plan: Luis Clark is here for an colonoscopy to be performed for Screening colonoscopy average risk   Risks, benefits, limitations, and alternatives regarding  colonoscopy have been reviewed with the patient.  Questions have been answered.  All parties agreeable.   Luis Bellows, MD  11/16/2021, 12:38 PM

## 2021-11-16 NOTE — Anesthesia Preprocedure Evaluation (Addendum)
Anesthesia Evaluation  Patient identified by MRN, date of birth, ID band Patient awake    Reviewed: Allergy & Precautions, NPO status , Patient's Chart, lab work & pertinent test results  History of Anesthesia Complications Negative for: history of anesthetic complications  Airway Mallampati: III  TM Distance: >3 FB Neck ROM: Full    Dental  (+) Poor Dentition, Loose Multiple loose teeth in front:   Pulmonary neg pulmonary ROS, neg sleep apnea, neg COPD, Patient abstained from smoking.Not current smoker, former smoker,    Pulmonary exam normal breath sounds clear to auscultation       Cardiovascular Exercise Tolerance: Poor METShypertension, Pt. on medications (-) CAD and (-) Past MI (-) dysrhythmias  Rhythm:Regular Rate:Normal - Systolic murmurs    Neuro/Psych negative neurological ROS  negative psych ROS   GI/Hepatic GERD  Controlled and Medicated,(+)     (-) substance abuse  ,   Endo/Other  neg diabetesMorbid obesity  Renal/GU negative Renal ROS     Musculoskeletal   Abdominal   Peds  Hematology   Anesthesia Other Findings Past Medical History: No date: Cataract No date: Enlarged prostate No date: GERD (gastroesophageal reflux disease) No date: HLD (hyperlipidemia) No date: Hypertension No date: Neuropathic pain No date: Thoracic stenosis  Reproductive/Obstetrics                             Anesthesia Physical Anesthesia Plan  ASA: 3  Anesthesia Plan: General   Post-op Pain Management: Minimal or no pain anticipated   Induction: Intravenous  PONV Risk Score and Plan: 3 and Propofol infusion, TIVA and Ondansetron  Airway Management Planned: Nasal Cannula  Additional Equipment: None  Intra-op Plan:   Post-operative Plan:   Informed Consent: I have reviewed the patients History and Physical, chart, labs and discussed the procedure including the risks, benefits and  alternatives for the proposed anesthesia with the patient or authorized representative who has indicated his/her understanding and acceptance.     Dental advisory given  Plan Discussed with: CRNA and Surgeon  Anesthesia Plan Comments: (Discussed risks of anesthesia with patient, including possibility of difficulty with spontaneous ventilation under anesthesia necessitating airway intervention, PONV, and rare risks such as cardiac or respiratory or neurological events, and allergic reactions. Discussed the role of CRNA in patient's perioperative care. Patient understands.)        Anesthesia Quick Evaluation

## 2021-11-16 NOTE — Transfer of Care (Signed)
Immediate Anesthesia Transfer of Care Note  Patient: Luis Clark  Procedure(s) Performed: COLONOSCOPY WITH PROPOFOL  Patient Location: Endoscopy Unit  Anesthesia Type:General  Level of Consciousness: drowsy  Airway & Oxygen Therapy: Patient Spontanous Breathing  Post-op Assessment: Report given to RN and Post -op Vital signs reviewed and stable  Post vital signs: Reviewed and stable  Last Vitals:  Vitals Value Taken Time  BP 111/75   Temp    Pulse 95 11/16/21 1317  Resp 20 11/16/21 1317  SpO2 96 % 11/16/21 1317  Vitals shown include unvalidated device data.  Last Pain:  Vitals:   11/16/21 1227  TempSrc: Temporal  PainSc: 0-No pain         Complications: No notable events documented.

## 2021-11-16 NOTE — Op Note (Signed)
[facility] Patient Name: Luis Clark Procedure Date: 11/16/2021 12:46 PM MRN: [id number] Account #: [id number] Date of Birth: 06-28-1950 Admit Type: Outpatient Age: 71 Room: [facility] ENDO ROOM 3 Gender: Male Note Status: Finalized Instrument Name: [name] [id number] Procedure:             Colonoscopy Indications:           Screening for colorectal malignant neoplasm Providers:             Jonathon Bellows MD, MD Referring MD:          Jon Billings (Referring MD) Medicines:             Monitored Anesthesia Care Complications:         No immediate complications. Procedure:             Pre-Anesthesia Assessment:                        - Prior to the procedure, a History and Physical was                         performed, and patient medications, allergies and                         sensitivities were reviewed. The patient's tolerance                         of previous anesthesia was reviewed.                        - The risks and benefits of the procedure and the                         sedation options and risks were discussed with the                         patient. All questions were answered and informed                         consent was obtained.                        - ASA Grade Assessment: II - A patient with mild                         systemic disease.                        After obtaining informed consent, the colonoscope was                         passed under direct vision. Throughout the procedure,                         the patient's blood pressure, pulse, and oxygen                         saturations were monitored continuously. The                         Colonoscope was introduced through  the anus and                         advanced to the the cecum, identified by the                         appendiceal orifice. The colonoscopy was performed                         with ease. The patient tolerated the procedure well.                          The quality of the bowel preparation was adequate. Findings:      The perianal and digital rectal examinations were normal.      Two sessile polyps were found in the descending colon and ascending       colon. The polyps were 4 to 5 mm in size. These polyps were removed with       a cold snare. Resection and retrieval were complete.      The exam was otherwise without abnormality on direct and retroflexion       views. Impression:            - Two 4 to 5 mm polyps in the descending colon and in                         the ascending colon, removed with a cold snare.                         Resected and retrieved.                        - The examination was otherwise normal on direct and                         retroflexion views. Recommendation:        - Discharge patient to home (with escort).                        - Resume previous diet.                        - Continue present medications.                        - Await pathology results.                        - Repeat colonoscopy for surveillance based on                         pathology results. Procedure Code(s):     --- Professional ---                        [phone], Colonoscopy, flexible; with removal of                         tumor(s), polyp(s), or other lesion(s) by snare  technique Diagnosis Code(s):     --- Professional ---                        Z12.11, Encounter for screening for malignant neoplasm                         of colon                        K63.5, Polyp of colon CPT copyright 2019 American Medical Association. All rights reserved. The codes documented in this report are preliminary and upon coder review may  be revised to meet current compliance requirements. Jonathon Bellows, MD Jonathon Bellows MD, MD 11/16/2021 1:16:17 PM This report has been signed electronically. Number of Addenda: 0 Note Initiated On: 11/16/2021 12:46 PM Scope Withdrawal Time: 0 hours 10 minutes 44  seconds  Total Procedure Duration: 0 hours 11 minutes 33 seconds  Estimated Blood Loss:  Estimated blood loss: none.      [facility]

## 2021-11-17 LAB — SURGICAL PATHOLOGY

## 2021-11-18 ENCOUNTER — Encounter: Payer: Self-pay | Admitting: Gastroenterology

## 2021-11-21 NOTE — Progress Notes (Unsigned)
There were no vitals taken for this visit.   Subjective:    Patient ID: Luis Clark, male    DOB: [birth year], 71 y.o.   MRN: [id number]  HPI: Luis Clark is a 71 y.o. male presenting on 11/22/2021 for comprehensive medical examination. Current medical complaints include:{Blank single:19197::"none","***"}  He currently lives with: Interim Problems from his last visit: {Blank single:19197::"yes","no"}  HYPERTENSION / HYPERLIPIDEMIA Satisfied with current treatment? {Blank single:19197::"yes","no"} Duration of hypertension: {Blank single:19197::"chronic","months","years"} BP monitoring frequency: {Blank single:19197::"not checking","rarely","daily","weekly","monthly","a few times a day","a few times a week","a few times a month"} BP range:  BP medication side effects: {Blank single:19197::"yes","no"} Past BP meds: {Blank ZLDJTTSV:77939::"QZES","PQZRAQTMAU","QJFHLKTGYB/WLSLHTDSKA","JGOTLXBW","IOMBTDHRCB","ULAGTXMIWO/EHOZ","YYQMGNOIBB (bystolic)","carvedilol","chlorthalidone","clonidine","diltiazem","exforge HCT","HCTZ","irbesartan (avapro)","labetalol","lisinopril","lisinopril-HCTZ","losartan (cozaar)","methyldopa","nifedipine","olmesartan (benicar)","olmesartan-HCTZ","quinapril","ramipril","spironalactone","tekturna","valsartan","valsartan-HCTZ","verapamil"} Duration of hyperlipidemia: {Blank single:19197::"chronic","months","years"} Cholesterol medication side effects: {Blank single:19197::"yes","no"} Cholesterol supplements: {Blank multiple:19196::"none","fish oil","niacin","red yeast rice"} Past cholesterol medications: {Blank multiple:19196::"none","atorvastain (lipitor)","lovastatin (mevacor)","pravastatin (pravachol)","rosuvastatin (crestor)","simvastatin (zocor)","vytorin","fenofibrate (tricor)","gemfibrozil","ezetimide (zetia)","niaspan","lovaza"} Medication compliance: {Blank single:19197::"excellent compliance","good compliance","fair compliance","poor compliance"} Aspirin:  {Blank single:19197::"yes","no"} Recent stressors: {Blank single:19197::"yes","no"} Recurrent headaches: {Blank single:19197::"yes","no"} Visual changes: {Blank single:19197::"yes","no"} Palpitations: {Blank single:19197::"yes","no"} Dyspnea: {Blank single:19197::"yes","no"} Chest pain: {Blank single:19197::"yes","no"} Lower extremity edema: {Blank single:19197::"yes","no"} Dizzy/lightheaded: {Blank single:19197::"yes","no"}  Depression Screen done today and results listed below:     10/27/2021    9:20 AM  Depression screen PHQ 2/9  Decreased Interest 0  Down, Depressed, Hopeless 0  PHQ - 2 Score 0  Altered sleeping 2  Tired, decreased energy 2  Change in appetite 0  Feeling bad or failure about yourself  0  Trouble concentrating 0  Moving slowly or fidgety/restless 0  Suicidal thoughts 0  PHQ-9 Score 4    The patient {has/does not CWUG:89169} a history of falls. I {did/did not:19850} complete a risk assessment for falls. A plan of care for falls {was/was not:19852} documented.   Past Medical History:  Past Medical History:  Diagnosis Date   Cataract    Enlarged prostate    GERD (gastroesophageal reflux disease)    HLD (hyperlipidemia)    Hypertension    Neuropathic pain    Thoracic stenosis     Surgical History:  Past Surgical History:  Procedure Laterality Date   BACK SURGERY     COLONOSCOPY WITH PROPOFOL N/A 11/16/2021   Procedure: COLONOSCOPY WITH PROPOFOL;  Surgeon: Jonathon Bellows, MD;  Location: [facility] ENDOSCOPY;  Service: Gastroenterology;  Laterality: N/A;   KNEE SURGERY     LUMBAR LAMINECTOMY/DECOMPRESSION MICRODISCECTOMY N/A 09/09/2020   Procedure: Thoracic eleven-twelve Laminectomy;  Surgeon: Ashok Pall, MD;  Location: [facility];  Service: Neurosurgery;  Laterality: N/A;    Medications:  Current Outpatient Medications on File Prior to Visit  Medication Sig   amLODipine (NORVASC) 10 MG tablet Take 1 tablet (10 mg total) by mouth daily.   atorvastatin  (LIPITOR) 20 MG tablet Take 20 mg by mouth daily.   DULoxetine (CYMBALTA) 60 MG capsule Take 1 capsule (60 mg total) by mouth daily.   gabapentin (NEURONTIN) 400 MG capsule Take 1 capsule (400 mg total) by mouth 3 (three) times daily.   LUMIGAN 0.01 % SOLN Place 1 drop into both eyes at bedtime.    Multiple Vitamin (MULTIVITAMIN) tablet Take 1 tablet by mouth daily.   omeprazole (PRILOSEC) 20 MG capsule Take 20 mg by mouth daily.   tamsulosin (FLOMAX) 0.4 MG CAPS capsule Take 0.4 mg by mouth daily.   tiZANidine (ZANAFLEX) 4 MG tablet Take 1 tablet (4 mg total) by mouth every 6 (six) hours as needed for muscle spasms.   VIAGRA 50 MG tablet Take 50 mg by mouth daily  as needed for erectile dysfunction.   No current facility-administered medications on file prior to visit.    Allergies:  Allergies  Allergen Reactions   Penicillin G Swelling   Penicillins Swelling    Inflammation 71 years old     Social History:  Social History   Socioeconomic History   Marital status: Legally Separated    Spouse name: Not on file   Number of children: Not on file   Years of education: Not on file   Highest education level: Not on file  Occupational History   Not on file  Tobacco Use   Smoking status: Former    Years: 40.00    Types: Cigarettes    Quit date: 2014    Years since quitting: 9.5   Smokeless tobacco: Never  Vaping Use   Vaping Use: Never used  Substance and Sexual Activity   Alcohol use: Yes    Alcohol/week: 3.0 - 4.0 standard drinks of alcohol    Types: 3 - 4 Cans of beer per week   Drug use: Not Currently   Sexual activity: Yes  Other Topics Concern   Not on file  Social History Narrative   Not on file   Social Determinants of Health   Financial Resource Strain: Low Risk  (10/27/2021)   Overall Financial Resource Strain (CARDIA)    Difficulty of Paying Living Expenses: Not hard at all  Food Insecurity: No Food Insecurity (10/27/2021)   Hunger Vital Sign    Worried  About Running Out of Food in the Last Year: Never true    Ran Out of Food in the Last Year: Never true  Transportation Needs: No Transportation Needs (10/27/2021)   PRAPARE - Hydrologist (Medical): No    Lack of Transportation (Non-Medical): No  Physical Activity: Insufficiently Active (10/27/2021)   Exercise Vital Sign    Days of Exercise per Week: 4 days    Minutes of Exercise per Session: 30 min  Stress: No Stress Concern Present (10/27/2021)   [place]    Feeling of Stress : Not at all  Social Connections: Moderately Integrated (10/27/2021)   Social Connection and Isolation Panel [NHANES]    Frequency of Communication with Friends and Family: Twice a week    Frequency of Social Gatherings with Friends and Family: Once a week    Attends Religious Services: More than 4 times per year    Active Member of [employer] or Organizations: Yes    Attends Archivist Meetings: More than 4 times per year    Marital Status: Separated  Intimate Partner Violence: Not At Risk (10/27/2021)   Humiliation, Afraid, Rape, and Kick questionnaire    Fear of Current or Ex-Partner: No    Emotionally Abused: No    Physically Abused: No    Sexually Abused: No   Social History   Tobacco Use  Smoking Status Former   Years: 40.00   Types: Cigarettes   Quit date: 2014   Years since quitting: 9.5  Smokeless Tobacco Never   Social History   Substance and Sexual Activity  Alcohol Use Yes   Alcohol/week: 3.0 - 4.0 standard drinks of alcohol   Types: 3 - 4 Cans of beer per week    Family History:  No family history on file.  Past medical history, surgical history, medications, allergies, family history and social history reviewed with patient today and changes made to appropriate areas of  the chart.   ROS All other ROS negative except what is listed above and in the HPI.      Objective:    There  were no vitals taken for this visit.  Wt Readings from Last 3 Encounters:  11/16/21 265 lb 8.7 oz (120.5 kg)  10/11/21 265 lb 9.6 oz (120.5 kg)  09/22/21 269 lb (122 kg)    Physical Exam  Results for orders placed or performed during the hospital encounter of 11/16/21  Surgical pathology  Result Value Ref Range   SURGICAL PATHOLOGY      SURGICAL PATHOLOGY CASE: [id number] PATIENT: [facility] Surgical Pathology Report     Specimen Submitted: A. Colon polyps x3, asc(1), des(2); cold snare  Clinical History: Screening colonoscopy. Colon polyps.    DIAGNOSIS: A. COLON POLYPS X3, ASCENDING (X1) AND DESCENDING (X2); COLD SNARE: - FRAGMENTS (X2) OF SESSILE SERRATED POLYPS. - NEGATIVE FOR DYSPLASIA AND MALIGNANCY.  GROSS DESCRIPTION: A. Labeled: Cold snare ascending x 1/descending x 2 colon polyp Received: Formalin Collection time: 1:07 PM on 11/16/2021 Placed into formalin time: 1:07 PM on 11/16/2021 Tissue fragment(s): Multiple Size: Aggregate, 1.5 x 0.5 x 0.1 cm Description: Received are tan soft tissue fragments, admixed with intestinal debris and hair.  The ratio of soft tissue to intestinal debris is 60: 40. Entirely submitted in 1 cassette.  CM 11/16/2021  Final Diagnosis performed by Allena Napoleon, MD.   Electronically signed 11/17/2021 8:36:56AM The electronic signature indicates th at the named Attending Pathologist has evaluated the specimen Technical component performed at [facility], [address], [place], [place] [address] Lab: [phone] Dir: Rush Farmer, MD, MMM  Professional component performed at [facility], [facility], [place], [place], [place] [address] Lab: [phone] Dir: Kathi Simpers, MD       Assessment & Plan:   Problem List Items Addressed This Visit       Cardiovascular and Mediastinum   Hypertension - Primary     Nervous and Auditory   Left hemiparesis ([place])     Other   HLD (hyperlipidemia)      Discussed aspirin prophylaxis for myocardial infarction prevention and decision was {Blank single:19197::"it was not indicated","made to continue ASA","made to start ASA","made to stop ASA","that we recommended ASA, and patient refused"}  LABORATORY TESTING:  Health maintenance labs ordered today as discussed above.   The natural history of prostate cancer and ongoing controversy regarding screening and potential treatment outcomes of prostate cancer has been discussed with the patient. The meaning of a false positive PSA and a false negative PSA has been discussed. He indicates understanding of the limitations of this screening test and wishes *** to proceed with screening PSA testing.   IMMUNIZATIONS:   - Tdap: Tetanus vaccination status reviewed: {tetanus status:315746}. - Influenza: {Blank single:19197::"Up to date","Administered today","Postponed to flu season","Refused","Given elsewhere"} - Pneumovax: {Blank single:19197::"Up to date","Administered today","Not applicable","Refused","Given elsewhere"} - Prevnar: {Blank single:19197::"Up to date","Administered today","Not applicable","Refused","Given elsewhere"} - COVID: {Blank single:19197::"Up to date","Administered today","Not applicable","Refused","Given elsewhere"} - HPV: {Blank single:19197::"Up to date","Administered today","Not applicable","Refused","Given elsewhere"} - Shingrix vaccine: {Blank single:19197::"Up to date","Administered today","Not applicable","Refused","Given elsewhere"}  SCREENING: - Colonoscopy: {Blank single:19197::"Up to date","Ordered today","Not applicable","Refused","Done elsewhere"}  Discussed with patient purpose of the colonoscopy is to detect colon cancer at curable precancerous or early stages   - AAA Screening: {Blank single:19197::"Up to date","Ordered today","Not applicable","Refused","Done elsewhere"}  -Hearing Test: {Blank single:19197::"Up to date","Ordered today","Not  applicable","Refused","Done elsewhere"}  -Spirometry: {Blank single:19197::"Up to date","Ordered today","Not applicable","Refused","Done elsewhere"}   PATIENT COUNSELING:  Sexuality: Discussed sexually transmitted diseases, partner selection, use of condoms, avoidance of unintended pregnancy  and contraceptive alternatives.   Advised to avoid cigarette smoking.  I discussed with the patient that most people either abstain from alcohol or drink within safe limits (<=14/week and <=4 drinks/occasion for males, <=7/weeks and <= 3 drinks/occasion for females) and that the risk for alcohol disorders and other health effects rises proportionally with the number of drinks per week and how often a drinker exceeds daily limits.  Discussed cessation/primary prevention of drug use and availability of treatment for abuse.   Diet: Encouraged to adjust caloric intake to maintain  or achieve ideal body weight, to reduce intake of dietary saturated fat and total fat, to limit sodium intake by avoiding high sodium foods and not adding table salt, and to maintain adequate dietary potassium and calcium preferably from fresh fruits, vegetables, and low-fat dairy products.    stressed the importance of regular exercise  Injury prevention: Discussed safety belts, safety helmets, smoke detector, smoking near bedding or upholstery.   Dental health: Discussed importance of regular tooth brushing, flossing, and dental visits.   Follow up plan: NEXT PREVENTATIVE PHYSICAL DUE IN 1 YEAR. No follow-ups on file.

## 2021-11-22 ENCOUNTER — Ambulatory Visit (INDEPENDENT_AMBULATORY_CARE_PROVIDER_SITE_OTHER): Payer: Medicare Other | Admitting: Nurse Practitioner

## 2021-11-22 ENCOUNTER — Encounter: Payer: Self-pay | Admitting: Nurse Practitioner

## 2021-11-22 VITALS — BP 134/86 | HR 88 | Temp 98.5°F | Ht 67.72 in | Wt 259.4 lb

## 2021-11-22 DIAGNOSIS — E782 Mixed hyperlipidemia: Secondary | ICD-10-CM

## 2021-11-22 DIAGNOSIS — Z23 Encounter for immunization: Secondary | ICD-10-CM | POA: Diagnosis not present

## 2021-11-22 DIAGNOSIS — I10 Essential (primary) hypertension: Secondary | ICD-10-CM

## 2021-11-22 DIAGNOSIS — G8194 Hemiplegia, unspecified affecting left nondominant side: Secondary | ICD-10-CM

## 2021-11-22 DIAGNOSIS — M792 Neuralgia and neuritis, unspecified: Secondary | ICD-10-CM

## 2021-11-22 DIAGNOSIS — Z0000 Encounter for general adult medical examination without abnormal findings: Secondary | ICD-10-CM

## 2021-11-22 LAB — URINALYSIS, ROUTINE W REFLEX MICROSCOPIC
Bilirubin, UA: NEGATIVE
Glucose, UA: NEGATIVE
Ketones, UA: NEGATIVE
Leukocytes,UA: NEGATIVE
Nitrite, UA: NEGATIVE
Protein,UA: NEGATIVE
RBC, UA: NEGATIVE
Specific Gravity, UA: 1.025 (ref 1.005–1.030)
Urobilinogen, Ur: 1 mg/dL (ref 0.2–1.0)
pH, UA: 6 (ref 5.0–7.5)

## 2021-11-22 NOTE — Assessment & Plan Note (Addendum)
Chronic.  Controlled.  Continue with current medication regimen of Gabapentin '400mg'$  TID.  Labs ordered today.  Return to clinic in 3 months for reevaluation.  Call sooner if concerns arise.

## 2021-11-22 NOTE — Assessment & Plan Note (Signed)
Chronic. Uses cane.  Exercises regularly to help with strength.

## 2021-11-22 NOTE — Assessment & Plan Note (Signed)
Chronic.  Controlled.  Continue with current medication regimen of Amlodipine '10mg'$ .  Labs ordered today.  Return to clinic in 3 months for reevaluation.  Call sooner if concerns arise.

## 2021-11-22 NOTE — Assessment & Plan Note (Signed)
Chronic.  Controlled.  Continue with current medication regimen of Atorvastatin 20mg daily.  Labs ordered today.  Return to clinic in 6 months for reevaluation.  Call sooner if concerns arise.   

## 2021-11-23 LAB — CBC WITH DIFFERENTIAL/PLATELET
Basophils Absolute: 0 10*3/uL (ref 0.0–0.2)
Basos: 0 %
EOS (ABSOLUTE): 0.3 10*3/uL (ref 0.0–0.4)
Eos: 7 %
Hematocrit: 43.5 % (ref 37.5–51.0)
Hemoglobin: 13.3 g/dL (ref 13.0–17.7)
Immature Grans (Abs): 0 10*3/uL (ref 0.0–0.1)
Immature Granulocytes: 0 %
Lymphocytes Absolute: 2.1 10*3/uL (ref 0.7–3.1)
Lymphs: 40 %
MCH: 22.5 pg — ABNORMAL LOW (ref 26.6–33.0)
MCHC: 30.6 g/dL — ABNORMAL LOW (ref 31.5–35.7)
MCV: 74 fL — ABNORMAL LOW (ref 79–97)
Monocytes Absolute: 0.5 10*3/uL (ref 0.1–0.9)
Monocytes: 9 %
Neutrophils Absolute: 2.3 10*3/uL (ref 1.4–7.0)
Neutrophils: 44 %
Platelets: 195 10*3/uL (ref 150–450)
RBC: 5.9 x10E6/uL — ABNORMAL HIGH (ref 4.14–5.80)
RDW: 14.7 % (ref 11.6–15.4)
WBC: 5.2 10*3/uL (ref 3.4–10.8)

## 2021-11-23 LAB — COMPREHENSIVE METABOLIC PANEL
ALT: 23 IU/L (ref 0–44)
AST: 27 IU/L (ref 0–40)
Albumin/Globulin Ratio: 1.3 (ref 1.2–2.2)
Albumin: 4.1 g/dL (ref 3.8–4.8)
Alkaline Phosphatase: 77 IU/L (ref 44–121)
BUN/Creatinine Ratio: 14 (ref 10–24)
BUN: 14 mg/dL (ref 8–27)
Bilirubin Total: 0.4 mg/dL (ref 0.0–1.2)
CO2: 21 mmol/L (ref 20–29)
Calcium: 9.2 mg/dL (ref 8.6–10.2)
Chloride: 102 mmol/L (ref 96–106)
Creatinine, Ser: 1 mg/dL (ref 0.76–1.27)
Globulin, Total: 3.2 g/dL (ref 1.5–4.5)
Glucose: 104 mg/dL — ABNORMAL HIGH (ref 70–99)
Potassium: 4.6 mmol/L (ref 3.5–5.2)
Sodium: 137 mmol/L (ref 134–144)
Total Protein: 7.3 g/dL (ref 6.0–8.5)
eGFR: 80 mL/min/{1.73_m2} (ref >59)

## 2021-11-23 LAB — LIPID PANEL
Chol/HDL Ratio: 3.9 ratio (ref 0.0–5.0)
Cholesterol, Total: 131 mg/dL (ref 100–199)
HDL: 34 mg/dL — ABNORMAL LOW (ref >39)
LDL Chol Calc (NIH): 68 mg/dL (ref 0–99)
Triglycerides: 169 mg/dL — ABNORMAL HIGH (ref 0–149)
VLDL Cholesterol Cal: 29 mg/dL (ref 5–40)

## 2021-11-23 LAB — PSA: Prostate Specific Ag, Serum: 1.7 ng/mL (ref 0.0–4.0)

## 2021-11-23 LAB — TSH: TSH: 1.68 u[IU]/mL (ref 0.450–4.500)

## 2021-11-23 NOTE — Progress Notes (Signed)
Please let patient know his lab work looks good.  His triglycerides are elevated.  I recommend decreasing processed foods and refined sugar intake. Otherwise, lab work is stable.  Follow up as discussed. Continue with current medication regimen.

## 2021-11-30 ENCOUNTER — Ambulatory Visit: Payer: Medicare Other | Admitting: Neurology

## 2021-11-30 ENCOUNTER — Ambulatory Visit (INDEPENDENT_AMBULATORY_CARE_PROVIDER_SITE_OTHER): Payer: Medicare Other | Admitting: Neurology

## 2021-11-30 VITALS — BP 155/88 | HR 87 | Ht 67.0 in | Wt 265.0 lb

## 2021-11-30 DIAGNOSIS — M48061 Spinal stenosis, lumbar region without neurogenic claudication: Secondary | ICD-10-CM

## 2021-11-30 DIAGNOSIS — M792 Neuralgia and neuritis, unspecified: Secondary | ICD-10-CM

## 2021-11-30 DIAGNOSIS — R269 Unspecified abnormalities of gait and mobility: Secondary | ICD-10-CM

## 2021-11-30 NOTE — Progress Notes (Signed)
ASSESSMENT AND PLAN  Luis Clark is a 71 y.o. 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 Christella Noa on Sep 06 2020. Continue to have gait abnormality, severe left knee extension, adduction, ankle dorsiflexion, weakness, absent left knee reflex, suggestive of left L2,, 3, 4 radiculopathy MRI of lumbar in June 2023 showed significant lipomatosis, causing moderate to severe spinal stenosis at different level, EMG nerve conduction study today showed no large fiber peripheral neuropathy, evidence of bilateral lumbosacral radiculopathy mainly involving left side, L2-S1 level, milder on the right side, L4-5 level, Abnormal findings meeting a potential candidate for lumbar decompression surgery, will refer to neurosurgeon for evaluation, Also ordered MRI of left femur region to rule out left extremity structural abnormality Neuropathic pain Continue Cymbalta 60 mg daily, gabapentin 300 mg 3 times daily  Right frontal fibrosis dysplasia -CT head August 2021 ordered by Dr. Christella Noa, right frontal skull lesion favored the  fibrous dysplasia   DIAGNOSTIC DATA (LABS, IMAGING, TESTING) - I reviewed patient records, labs, notes, testing and imaging myself where available.  HISTORICAL  Luis Clark, is a 71 years old male seen in request by his orthopedic surgeon Dr. Gladstone Lighter for evaluation of left leg weakness, numbness, initial evaluation is on November 17, 2019, he is accompanied by his significant others at today's visit.  I reviewed and summarized the referring note.  Past medical history of Hypertension, taking Norvasc 5 mg daily Hyperlipidemia, Lipitor 20 mg daily Acid reflux, omeprazole 20 mg daily  He is a poor historian, reported lifelong history of left side difficulty, he was enlisted in the [employer], was told he his left leg is shorter, he had no trouble walking, but he did  reported with prolonged running, he would notice difficulty, his friend who has known him for 20 years, noted that he has a limp gait for many years.  His left side difficulty gradually getting worse, especially since last 5 years, he noticed increased left leg numbness weakness, numbness involving whole left leg from hip, left leg weakness, difficulty picking up against gravity.,  He also noticed worsening left arm weakness, numbness, he denies speech difficulty, no dysphagia, no bowel and bladder incontinence,  Personally reviewed MRI of lumbar spine from [facility] on December 27, 2019, multilevel lumbar degenerative changes 311 2 L1 partial demonstration of bilateral posterolateral and the fracture foraminal disc protrusion with squatting effacement probable significant foraminal stenosis bilaterally, L4-5 moderate central and left foraminal stenosis, variable degree of foraminal stenosis at the other levels  UPDATE Apr 13 2020: I personally reviewed MRI of the brain without contrast on December 04, 2019, right frontal heterogeneous calvarial lesion, expanding the diploic space, mass-effect upon the right inferior frontal lobe, right orbit, right frontal sinus, no abnormal signal within the brain parenchyma  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 Christella Noa, offered thoracic decompression surgery, but patient decided not to proceed with it,  He complains of worsening gait abnormality, left lower extremity weakness, occasionally urinary and bowel incontinence,  He return for electrodiagnostic study today, which showed evidence of active neuropathic changes involving bilateral lower extremity muscles, left worse than right, also chronic neuropathic changes involving  left cervical myotomes, C5-7.4  UPDATE  October 21 2020: He finally received thoracic T10-11-12 laminectomy for spinal decompression by Dr. Christella Noa on Sep 06 2020.  Surgery did help him walking better, spend more time up on his feet, but still has dense bilateral lower extremity numbness, left worse than right, especially when bearing weight, significant gait abnormalities, especially He denies bowel bladder incontinence  UPDATE Sep 22 2021: He drove himself to clinic today, he lives with his brother in [place], he missed his previous MRI of thoracic and lumbar skin due to transportation issues, he spent most of the time sitting down, complains of intermittent lower extremity stinging pain, gait abnormality, progress from walker to 1 point cane now,  He also complains of difficulty initiating urine, occasionally incontinence, denies bowel incontinence,  He has not seen his primary care physician for more than 2 years, today blood pressure is elevated, 161/96, he is on only low-dose amlodipine 5 mg, may increase to 10 mg daily,  For his lower extremity neuropathic pain he is taking Cymbalta 60 mg, gabapentin 300 mg 3 times a day, he is not taking his medicine consistently,   Update November 30, 2021 He continued complaints of slow progressive left lower extremity weakness, worsening gait abnormality, denies bowel or bladder incontinence  We personally reviewed MRI of the lumbar spine without contrast October 17, 2021, epidural lipomatosis combined with degenerative changes cause severe spinal stenosis adjacent to the L5 vertebral body, there is potential to compression nerve roots, mild spinal stenosis at L3-4, L5-S1 due to increased epidural fat, and other degenerative changes,  He return for electrodiagnostic study today, demonstrate chronic bilateral lumbar radiculopathy left side mainly involving L2-S1 myotomes, much more severe than the right side, which mainly involving right L4-5 myotomes, in  addition there is evidence of moderate right carpal tunnel syndromes.  There is no evidence of large fiber peripheral neuropathy  He did report some improvement after thoracic decompression surgery, seems to walk better, no longer decline rapidly  PHYSICAL EXAM   Vitals:   09/22/21 1023  BP: (!) 161/96  Pulse: 89  Weight: 269 lb (122 kg)  Height: '5\' 7"'$  (1.702 m)   Body mass index is 42.13 kg/m.  PHYSICAL EXAMNIATION:  Gen: NAD, conversant, well nourised, well groomed, obese, sleepy                NEUROLOGICAL EXAM:  MENTAL STATUS: Speech/Cognition: Awake, alert, oriented to history taking and casual conversation  CRANIAL NERVES: CN II: Visual fields are full to confrontation. Pupils are round equal and briskly reactive to light. CN III, IV, VI: extraocular movement are normal. No ptosis. CN V: Facial sensation is intact to light touch CN VII: Face is symmetric with normal eye closure  CN VIII: Hearing is normal to causal conversation. CN IX, X: Phonation is normal. CN XI: Head turning and shoulder shrug are intact  MOTOR: He has no significant bilateral upper extremity weakness,   LE Hip adduction Hip Flexion Knee flexion Knee extension Ankle Dorsiflexion  R '4 5 5 5 5  '$ L '3 3 4 4 4    '$ REFLEXES: Symmetric bilateral upper extremity, Knee R/L 3/0, ankle 0/0  SENSORY: Decreased sensation to soft touch at the left lower extremity from: To above knee level  COORDINATION: Difficulty lifting the left leg for heel-to-shin  GAIT/STANCE: He needs to push-up to get up from seated position, dragging left leg, difficulty locking his left knee, rely on his cane  REVIEW OF SYSTEMS: Full 14 system review of systems performed and  notable only for as above  See HPI  ALLERGIES: Allergies  Allergen Reactions   Penicillin G Swelling   Penicillins Swelling    Inflammation 71 years old     HOME MEDICATIONS: Current Outpatient Medications  Medication Sig Dispense Refill    amLODipine (NORVASC) 10 MG tablet Take 1 tablet (10 mg total) by mouth daily. 90 tablet 4   atorvastatin (LIPITOR) 20 MG tablet Take 20 mg by mouth daily.     DULoxetine (CYMBALTA) 60 MG capsule Take 1 capsule (60 mg total) by mouth daily. 90 capsule 3   gabapentin (NEURONTIN) 400 MG capsule Take 1 capsule (400 mg total) by mouth 3 (three) times daily. 270 capsule 1   LUMIGAN 0.01 % SOLN Place 1 drop into both eyes at bedtime.      Multiple Vitamin (MULTIVITAMIN) tablet Take 1 tablet by mouth daily.     omeprazole (PRILOSEC) 20 MG capsule Take 20 mg by mouth daily.     tamsulosin (FLOMAX) 0.4 MG CAPS capsule Take 0.4 mg by mouth daily.     tiZANidine (ZANAFLEX) 4 MG tablet Take 1 tablet (4 mg total) by mouth every 6 (six) hours as needed for muscle spasms. 60 tablet 0   VIAGRA 50 MG tablet Take 50 mg by mouth daily as needed for erectile dysfunction.     No current facility-administered medications for this visit.    PAST MEDICAL HISTORY: Past Medical History:  Diagnosis Date   Cataract    Enlarged prostate    GERD (gastroesophageal reflux disease)    HLD (hyperlipidemia)    Hypertension    Neuropathic pain    Thoracic stenosis     PAST SURGICAL HISTORY: none FAMILY HISTORY: No family history on file.  SOCIAL HISTORY: Social History   Socioeconomic History   Marital status: Legally Separated    Spouse name: Not on file   Number of children: Not on file   Years of education: Not on file   Highest education level: Not on file  Occupational History   Not on file  Tobacco Use   Smoking status: Former    Years: 40.00    Types: Cigarettes    Quit date: 2014    Years since quitting: 9.5   Smokeless tobacco: Never  Vaping Use   Vaping Use: Never used  Substance and Sexual Activity   Alcohol use: Yes    Alcohol/week: 3.0 - 4.0 standard drinks of alcohol    Types: 3 - 4 Cans of beer per week   Drug use: Not Currently   Sexual activity: Yes  Other Topics Concern   Not  on file  Social History Narrative   Not on file   Social Determinants of Health   Financial Resource Strain: Low Risk  (10/27/2021)   Overall Financial Resource Strain (CARDIA)    Difficulty of Paying Living Expenses: Not hard at all  Food Insecurity: No Food Insecurity (10/27/2021)   Hunger Vital Sign    Worried About Running Out of Food in the Last Year: Never true    Ran Out of Food in the Last Year: Never true  Transportation Needs: No Transportation Needs (10/27/2021)   PRAPARE - Hydrologist (Medical): No    Lack of Transportation (Non-Medical): No  Physical Activity: Insufficiently Active (10/27/2021)   Exercise Vital Sign    Days of Exercise per Week: 4 days    Minutes of Exercise per Session: 30 min  Stress: No Stress Concern Present (  10/27/2021)   [place]    Feeling of Stress : Not at all  Social Connections: Moderately Integrated (10/27/2021)   Social Connection and Isolation Panel [NHANES]    Frequency of Communication with Friends and Family: Twice a week    Frequency of Social Gatherings with Friends and Family: Once a week    Attends Religious Services: More than 4 times per year    Active Member of [employer] or Organizations: Yes    Attends Archivist Meetings: More than 4 times per year    Marital Status: Separated  Intimate Partner Violence: Not At Risk (10/27/2021)   Humiliation, Afraid, Rape, and Kick questionnaire    Fear of Current or Ex-Partner: No    Emotionally Abused: No    Physically Abused: No    Sexually Abused: No   Marcial Pacas, M.D. Ph.D.  [facility] [place],  [address] Phone: [phone] Fax:      [phone]

## 2021-12-01 ENCOUNTER — Telehealth: Payer: Self-pay | Admitting: Neurology

## 2021-12-01 NOTE — Telephone Encounter (Signed)
Referral for Neurosurgery sent to [facility] [phone]. 

## 2021-12-01 NOTE — Telephone Encounter (Signed)
[employer] medicare/[place] medicaid NPR sent to [facility]

## 2021-12-02 NOTE — Progress Notes (Signed)
EMG is under procedure tab 

## 2021-12-02 NOTE — Procedures (Signed)
Full Name: Luis Clark Gender: Male MRN #: [id number] Date of Birth: [birth year]    Visit Date: 11/30/2021 10:53 Age: 71 Years Examining Physician: Marcial Pacas Referring Physician: Marcial Pacas Height: 5 feet 8 inch History: 71 year old male with progressive worsening bilateral lower extremity weakness left worse than right, gait abnormality, history of T10-12 thoracic decompression surgery, MRI of lumbar spine showed significant lipomatosis, was potential severe spinal stenosis  Summary of the test: Nerve conduction study:  Bilateral sural, right superficial peroneal sensory responses were normal.  Right ulnar sensory responses were normal, right median sensory response showed moderately prolonged peak latency with mildly decreased snap amplitude.  Right tibial motor response showed mildly decreased CMAP amplitude, otherwise normal distal latency, conduction velocity, and F-wave latency.  Right peroneal to EDB motor response showed significantly decreased CMAP amplitude.  Right median motor responses were normal, right ulnar motor responses were within normal limit, but there was difficult to get response from distal stimulation site due to technical difficulties.  Electromyography:  Selected needle examinations were performed at bilateral lower extremity muscles and bilateral lumbosacral paraspinal muscles.  There was evidence of active/chronic neuropathic changes involving left L2-S1 myotomes.  There is also evidence of chronic neuropathic changes involving the right L4-5 myotomes.  There was increased insertional activity at bilateral lower lumbar paraspinal muscles  Conclusion: This is an abnormal study.  There is evidence of chronic bilateral lumbar radiculopathy, left side much more severe than the right side, involving left L2-S1 myotomes, right side mainly involving L4-5 myotomes, there is no evidence of large fiber peripheral neuropathy.  In addition, there is also  evidence of moderate right carpal tunnel syndromes.    ------------------------------- Catalina Pizza.D.Ph.D.  [facility] [address] [place], [place] [address] Tel: [phone] Fax: [phone]  Verbal informed consent was obtained from the patient, patient was informed of potential risk of procedure, including bruising, bleeding, hematoma formation, infection, muscle weakness, muscle pain, numbness, among others.        [place]    Nerve / Sites Muscle Latency Ref. Amplitude Ref. Rel Amp Segments Distance Velocity Ref. Area    ms ms mV mV %  cm m/s m/s mVms  R Median - APB     Wrist APB 5.0 ?4.4 6.6 ?4.0 100 Wrist - APB 7   20.1     Upper arm APB 10.0  6.7  101 Upper arm - Wrist 25 50 ?49 21.1  R Ulnar - ADM     Wrist ADM 1.8 ?3.3 2.9 ?6.0 100 Wrist - ADM 7   6.7     B.Elbow ADM 5.3  8.3  283 B.Elbow - Wrist   ?49 23.0     A.Elbow ADM 8.1  7.1  86.2 A.Elbow - B.Elbow   ?49 20.1  R Peroneal - EDB     Ankle EDB 5.1 ?6.5 0.3 ?2.0 100 Ankle - EDB 9   0.9     Fib head EDB 13.4  0.2  82.7 Fib head - Ankle 32 39 ?44 0.6     Pop fossa EDB 15.7     Pop fossa - Fib head 13 55 ?44          Pop fossa - Ankle      R Tibial - AH     Ankle AH 4.8 ?5.8 2.3 ?4.0 100 Ankle - AH 9   3.5     Pop fossa AH 16.0  1.6  68.8 Pop  fossa - Ankle 51 46 ?41 4.0               SNC    Nerve / Sites Rec. Site Peak Lat Ref.  Amp Ref. Segments Distance    ms ms V V  cm  L Sural - Ankle (Calf) (myotomes,)     Calf Ankle 3.9 ?4.4 13 ?6 Calf - Ankle 14  R Sural - Ankle (Calf)     Calf Ankle 3.5 ?4.4 6 ?6 Calf - Ankle 14  R Superficial peroneal - Ankle     Lat leg Ankle 3.8 ?4.4 8 ?6 Lat leg - Ankle 14  R Median - Orthodromic (Dig II, Mid palm)     Dig II Wrist 4.4 ?3.4 6 ?10 Dig II - Wrist 13  R Ulnar - Orthodromic, (Dig V, Mid palm)     Dig V Wrist 2.8 ?3.1 5 ?5 Dig V - Wrist 31               F  Wave    Nerve F Lat Ref.   ms ms  R Ulnar - ADM 29.7 ?32.0  R Tibial - AH  56.0 ?56.0         EMG Summary Table    Spontaneous MUAP Recruitment  Muscle IA Fib PSW Fasc Other Amp Dur. Poly Pattern  R. Tibialis anterior Normal None None None _______ Normal Normal Normal Reduced  R. Tibialis posterior Normal None None None _______ Normal Normal Normal Reduced  R. Peroneus longus Normal None None None _______ Normal Normal Normal Reduced  R. Gastrocnemius (Medial head) Normal None None None _______ Normal Normal Normal Reduced  R. Vastus lateralis Normal None None None _______ Normal Normal Normal Reduced  L. Tibialis anterior Increased None None None _______ Normal Normal Normal Reduced  L. Tibialis posterior Increased 1+ None None _______ Normal Normal Normal Reduced  L. Peroneus longus Increased None None None _______ Normal Normal Normal Reduced  L. Gastrocnemius (Medial head) Increased None None None _______ Normal Normal Normal Reduced  L. Vastus lateralis Increased 1+ None None _______ Increased Increased 1+ Reduced  L. Adductor longus Increased 1+ None None _______ Increased Increased 1+ Reduced  L. Biceps femoris (long head) Increased None None None _______ Normal Normal Normal Reduced  R. Lumbar paraspinals (low) Increased 1+ None None _______ Normal Normal Normal Normal  R. Lumbar paraspinals (mid) Increased 1+ None None _______ Normal Normal Normal Normal  L. Lumbar paraspinals (low) Increased 1+ None None _______ Normal Normal Normal Normal  L. Lumbar paraspinals (mid) Normal None None None _______ Normal Normal Normal Normal  L. Iliopsoas Increased None None None _______ Normal Normal Normal Reduced

## 2021-12-03 ENCOUNTER — Encounter: Payer: Self-pay | Admitting: Neurology

## 2021-12-15 ENCOUNTER — Other Ambulatory Visit: Payer: Self-pay | Admitting: Nurse Practitioner

## 2021-12-16 NOTE — Telephone Encounter (Signed)
Requested medication (s) are due for refill today:   No  Requested medication (s) are on the active medication list:   Yes  Future visit scheduled:   Yes   Last ordered: 10/11/2021 #270, 1 refill  Returned because pharmacy is requesting a #100 supply/1 yr supply.   Requested Prescriptions  Pending Prescriptions Disp Refills   gabapentin (NEURONTIN) 400 MG capsule [Pharmacy Med Name: Gabapentin 400 MG Oral Capsule] 300 capsule 2    Sig: TAKE 1 CAPSULE BY MOUTH 3 TIMES  DAILY     Neurology: Anticonvulsants - gabapentin Passed - 12/15/2021 10:16 PM      Passed - Cr in normal range and within 360 days    Creatinine, Ser  Date Value Ref Range Status  11/22/2021 1.00 0.76 - 1.27 mg/dL Final         Passed - Completed PHQ-2 or PHQ-9 in the last 360 days      Passed - Valid encounter within last 12 months    Recent Outpatient Visits           3 weeks ago Annual physical exam   [facility] Jon Billings, NP   2 months ago Left hemiparesis [facility])   [facility] Jon Billings, NP       Future Appointments             In 2 months Jon Billings, NP [facility], [place]

## 2021-12-19 ENCOUNTER — Ambulatory Visit
Admission: RE | Admit: 2021-12-19 | Discharge: 2021-12-19 | Disposition: A | Discharge status: Home / Self Care | Payer: Medicare Other | Source: Ambulatory Visit | Attending: Neurology | Admitting: Neurology

## 2021-12-19 DIAGNOSIS — M25462 Effusion, left knee: Secondary | ICD-10-CM | POA: Diagnosis not present

## 2021-12-19 DIAGNOSIS — R269 Unspecified abnormalities of gait and mobility: Secondary | ICD-10-CM | POA: Insufficient documentation

## 2021-12-19 DIAGNOSIS — M1612 Unilateral primary osteoarthritis, left hip: Secondary | ICD-10-CM | POA: Diagnosis not present

## 2021-12-19 DIAGNOSIS — M48061 Spinal stenosis, lumbar region without neurogenic claudication: Secondary | ICD-10-CM | POA: Insufficient documentation

## 2021-12-19 DIAGNOSIS — M792 Neuralgia and neuritis, unspecified: Secondary | ICD-10-CM | POA: Diagnosis not present

## 2021-12-19 DIAGNOSIS — G629 Polyneuropathy, unspecified: Secondary | ICD-10-CM | POA: Diagnosis not present

## 2021-12-19 DIAGNOSIS — S76312A Strain of muscle, fascia and tendon of the posterior muscle group at thigh level, left thigh, initial encounter: Secondary | ICD-10-CM | POA: Diagnosis not present

## 2021-12-20 ENCOUNTER — Telehealth: Payer: Self-pay | Admitting: Neurology

## 2021-12-20 NOTE — Telephone Encounter (Signed)
I spoke to the patient and notified him of this femur MRI results. He is agreeable to see neurosurgery. He has not received a call from that office yet. Referral place 11/30/21.   He is aware our office will check on the status of this referral for him. If he has not heard back by the end of next week, then he will call our office.

## 2021-12-20 NOTE — Telephone Encounter (Signed)
1. Mild osteoarthritis of bilateral hips. 2. Severe atrophy of the left semimembranosus and semitendinosis muscles. 3. Severe atrophy of the left vastus medialis, vastus lateralis, and vastus intermedius muscles. 4. Severe atrophy of the left adductor musculature. 5. Mild tendinosis of the left hamstring origin with a partial-thickness tear. 6. Mild tendinosis of the right hamstring origin.    Please call patient, MRI of the left leg showed significant atrophy of multiple proximal left lower extremity muscles  I have referred him to see neurosurgeon following last visit end of July, please check on the progress and plan from neurosurgery,

## 2022-02-23 ENCOUNTER — Ambulatory Visit: Payer: Medicare Other | Admitting: Nurse Practitioner

## 2022-03-07 ENCOUNTER — Ambulatory Visit: Payer: Medicare Other | Admitting: Nurse Practitioner

## 2022-03-20 ENCOUNTER — Other Ambulatory Visit: Payer: Self-pay | Admitting: Neurology

## 2022-03-23 ENCOUNTER — Ambulatory Visit: Payer: Medicare Other | Admitting: Nurse Practitioner

## 2022-04-13 ENCOUNTER — Ambulatory Visit: Payer: Medicare Other | Admitting: Nurse Practitioner

## 2022-05-16 ENCOUNTER — Ambulatory Visit: Payer: Medicare HMO | Admitting: Nurse Practitioner

## 2022-06-01 ENCOUNTER — Ambulatory Visit: Payer: Medicare HMO | Admitting: Nurse Practitioner

## 2022-06-06 ENCOUNTER — Encounter: Payer: Self-pay | Admitting: Neurology

## 2022-06-06 ENCOUNTER — Ambulatory Visit: Payer: Medicare HMO | Admitting: Neurology

## 2022-06-06 ENCOUNTER — Telehealth: Payer: Self-pay | Admitting: Neurology

## 2022-06-06 NOTE — Telephone Encounter (Signed)
error 

## 2022-06-27 ENCOUNTER — Telehealth: Payer: Self-pay | Admitting: Neurology

## 2022-06-27 NOTE — Telephone Encounter (Signed)
I believe this patient needs a PA for cymbalta. Thanks

## 2022-06-27 NOTE — Telephone Encounter (Signed)
Pt called needing a refill on his DULoxetine (CYMBALTA) 60 MG capsule but states that the pharmacy informed him that his insurance will not cover it. Pt's insurance states that he may need a new PA. Please advise.

## 2022-06-28 ENCOUNTER — Other Ambulatory Visit (HOSPITAL_COMMUNITY): Payer: Self-pay

## 2022-06-29 NOTE — Telephone Encounter (Signed)
Noted  

## 2022-06-29 NOTE — Telephone Encounter (Signed)
Called pt left a VM message that he should be able to pick medication up from pharmacy.

## 2022-08-08 ENCOUNTER — Ambulatory Visit: Payer: Medicare HMO | Admitting: Nurse Practitioner

## 2022-08-22 ENCOUNTER — Ambulatory Visit: Payer: 59 | Admitting: Nurse Practitioner

## 2022-08-22 NOTE — Progress Notes (Deleted)
There were no vitals taken for this visit.   Subjective:    Patient ID: Luis Clark, male    DOB: Sep 15, 1950, 72 y.o.   MRN: [id number]  HPI: Luis Clark is a 72 y.o. male  No chief complaint on file.  HYPERTENSION / HYPERLIPIDEMIA Satisfied with current treatment? {Blank single:19197::"yes","no"} Duration of hypertension: {Blank single:19197::"chronic","months","years"} BP monitoring frequency: {Blank single:19197::"not checking","rarely","daily","weekly","monthly","a few times a day","a few times a week","a few times a month"} BP range:  BP medication side effects: {Blank single:19197::"yes","no"} Past BP meds: {Blank multiple:19196::"none","amlodipine","amlodipine/benazepril","atenolol","benazepril","benazepril/HCTZ","bisoprolol (bystolic)","carvedilol","chlorthalidone","clonidine","diltiazem","exforge HCT","HCTZ","irbesartan (avapro)","labetalol","lisinopril","lisinopril-HCTZ","losartan (cozaar)","methyldopa","nifedipine","olmesartan (benicar)","olmesartan-HCTZ","quinapril","ramipril","spironalactone","tekturna","valsartan","valsartan-HCTZ","verapamil"} Duration of hyperlipidemia: {Blank single:19197::"chronic","months","years"} Cholesterol medication side effects: {Blank single:19197::"yes","no"} Cholesterol supplements: {Blank multiple:19196::"none","fish oil","niacin","red yeast rice"} Past cholesterol medications: {Blank multiple:19196::"none","atorvastain (lipitor)","lovastatin (mevacor)","pravastatin (pravachol)","rosuvastatin (crestor)","simvastatin (zocor)","vytorin","fenofibrate (tricor)","gemfibrozil","ezetimide (zetia)","niaspan","lovaza"} Medication compliance: {Blank single:19197::"excellent compliance","good compliance","fair compliance","poor compliance"} Aspirin: {Blank single:19197::"yes","no"} Recent stressors: {Blank single:19197::"yes","no"} Recurrent headaches: {Blank single:19197::"yes","no"} Visual changes: {Blank single:19197::"yes","no"} Palpitations:  {Blank single:19197::"yes","no"} Dyspnea: {Blank single:19197::"yes","no"} Chest pain: {Blank single:19197::"yes","no"} Lower extremity edema: {Blank single:19197::"yes","no"} Dizzy/lightheaded: {Blank single:19197::"yes","no"}  Relevant past medical, surgical, family and social history reviewed and updated as indicated. Interim medical history since our last visit reviewed. Allergies and medications reviewed and updated.  Review of Systems  Per HPI unless specifically indicated above     Objective:    There were no vitals taken for this visit.  Wt Readings from Last 3 Encounters:  11/30/21 265 lb (120.2 kg)  11/22/21 259 lb 6.4 oz (117.7 kg)  11/16/21 265 lb 8.7 oz (120.5 kg)    Physical Exam  Results for orders placed or performed in visit on 11/22/21  TSH  Result Value Ref Range   TSH 1.680 0.450 - 4.500 uIU/mL  PSA  Result Value Ref Range   Prostate Specific Ag, Serum 1.7 0.0 - 4.0 ng/mL  Lipid panel  Result Value Ref Range   Cholesterol, Total 131 100 - 199 mg/dL   Triglycerides 413 (H) 0 - 149 mg/dL   HDL 34 (L) >24 mg/dL   VLDL Cholesterol Cal 29 5 - 40 mg/dL   LDL Chol Calc (NIH) 68 0 - 99 mg/dL   Chol/HDL Ratio 3.9 0.0 - 5.0 ratio  CBC with Differential/Platelet  Result Value Ref Range   WBC 5.2 3.4 - 10.8 x10E3/uL   RBC 5.90 (H) 4.14 - 5.80 x10E6/uL   Hemoglobin 13.3 13.0 - 17.7 g/dL   Hematocrit 40.1 02.7 - 51.0 %   MCV 74 (L) 79 - 97 fL   MCH 22.5 (L) 26.6 - 33.0 pg   MCHC 30.6 (L) 31.5 - 35.7 g/dL   RDW 25.3 66.4 - 40.3 %   Platelets 195 150 - 450 x10E3/uL   Neutrophils 44 Not Estab. %   Lymphs 40 Not Estab. %   Monocytes 9 Not Estab. %   Eos 7 Not Estab. %   Basos 0 Not Estab. %   Neutrophils Absolute 2.3 1.4 - 7.0 x10E3/uL   Lymphocytes Absolute 2.1 0.7 - 3.1 x10E3/uL   Monocytes Absolute 0.5 0.1 - 0.9 x10E3/uL   EOS (ABSOLUTE) 0.3 0.0 - 0.4 x10E3/uL   Basophils Absolute 0.0 0.0 - 0.2 x10E3/uL   Immature Granulocytes 0 Not Estab. %    Immature Grans (Abs) 0.0 0.0 - 0.1 x10E3/uL  Comprehensive metabolic panel  Result Value Ref Range   Glucose 104 (H) 70 - 99 mg/dL   BUN 14 8 - 27 mg/dL   Creatinine, Ser 4.74 0.76 - 1.27 mg/dL   eGFR 80 >25 ZD/GLO/7.56   BUN/Creatinine Ratio 14 10 -  24   Sodium 137 134 - 144 mmol/L   Potassium 4.6 3.5 - 5.2 mmol/L   Chloride 102 96 - 106 mmol/L   CO2 21 20 - 29 mmol/L   Calcium 9.2 8.6 - 10.2 mg/dL   Total Protein 7.3 6.0 - 8.5 g/dL   Albumin 4.1 3.8 - 4.8 g/dL   Globulin, Total 3.2 1.5 - 4.5 g/dL   Albumin/Globulin Ratio 1.3 1.2 - 2.2   Bilirubin Total 0.4 0.0 - 1.2 mg/dL   Alkaline Phosphatase 77 44 - 121 IU/L   AST 27 0 - 40 IU/L   ALT 23 0 - 44 IU/L  Urinalysis, Routine w reflex microscopic  Result Value Ref Range   Specific Gravity, UA 1.025 1.005 - 1.030   pH, UA 6.0 5.0 - 7.5   Color, UA Yellow Yellow   Appearance Ur Clear Clear   Leukocytes,UA Negative Negative   Protein,UA Negative Negative/Trace   Glucose, UA Negative Negative   Ketones, UA Negative Negative   RBC, UA Negative Negative   Bilirubin, UA Negative Negative   Urobilinogen, Ur 1.0 0.2 - 1.0 mg/dL   Nitrite, UA Negative Negative      Assessment & Plan:   Problem List Items Addressed This Visit       Cardiovascular and Mediastinum   Hypertension     Nervous and Auditory   Left hemiparesis - Primary     Other   HLD (hyperlipidemia)     Follow up plan: No follow-ups on file.

## 2022-08-23 ENCOUNTER — Other Ambulatory Visit: Payer: Self-pay | Admitting: Nurse Practitioner

## 2022-08-23 NOTE — Telephone Encounter (Signed)
Medication Refill - Medication: VIAGRA 50 MG tablet   Has the patient contacted their pharmacy? No. (Agent: If no, request that the patient contact the pharmacy for the refill. If patient does not wish to contact the pharmacy document the reason why and proceed with request.) (Agent: If yes, when and what did the pharmacy advise?)  Preferred Pharmacy (with phone number or street name):  [facility] - [place], [place] - [address] Phone: [phone]  Fax: [phone]     Has the patient been seen for an appointment in the last year OR does the patient have an upcoming appointment? Yes.    Agent: Please be advised that RX refills may take up to 3 business days. We ask that you follow-up with your pharmacy.

## 2022-08-24 NOTE — Telephone Encounter (Signed)
Requested medication (s) are due for refill today: yes  Requested medication (s) are on the active medication list: yes  Last refill:  09/01/20  Future visit scheduled: yes  Notes to clinic:  Unable to refill per protocol, last refill by another provider.  Routing for approval.     Requested Prescriptions  Pending Prescriptions Disp Refills   VIAGRA 50 MG tablet 10 tablet     Sig: Take 1 tablet (50 mg total) by mouth daily as needed for erectile dysfunction.     Urology: Erectile Dysfunction Agents Failed - 08/23/2022 10:45 AM      Failed - Last BP in normal range    BP Readings from Last 1 Encounters:  11/30/21 (!) 155/88         Passed - AST in normal range and within 360 days    AST  Date Value Ref Range Status  11/22/2021 27 0 - 40 IU/L Final         Passed - ALT in normal range and within 360 days    ALT  Date Value Ref Range Status  11/22/2021 23 0 - 44 IU/L Final         Passed - Valid encounter within last 12 months    Recent Outpatient Visits           9 months ago Annual physical exam   [facility] Larae Grooms, NP   10 months ago Left hemiparesis [facility])   [facility] Larae Grooms, NP

## 2022-09-04 ENCOUNTER — Other Ambulatory Visit: Payer: Self-pay | Admitting: Nurse Practitioner

## 2022-09-05 NOTE — Telephone Encounter (Signed)
Requested Prescriptions  Pending Prescriptions Disp Refills   gabapentin (NEURONTIN) 400 MG capsule [Pharmacy Med Name: Gabapentin 400 MG Oral Capsule] 300 capsule 0    Sig: TAKE 1 CAPSULE BY MOUTH 3 TIMES  DAILY     Neurology: Anticonvulsants - gabapentin Passed - 09/04/2022 11:18 AM      Passed - Cr in normal range and within 360 days    Creatinine, Ser  Date Value Ref Range Status  11/22/2021 1.00 0.76 - 1.27 mg/dL Final         Passed - Completed PHQ-2 or PHQ-9 in the last 360 days      Passed - Valid encounter within last 12 months    Recent Outpatient Visits           9 months ago Annual physical exam   [facility] Larae Grooms, NP   10 months ago Left hemiparesis [facility])   [facility] Larae Grooms, NP

## 2022-10-30 ENCOUNTER — Ambulatory Visit (INDEPENDENT_AMBULATORY_CARE_PROVIDER_SITE_OTHER): Payer: 59

## 2022-10-30 VITALS — Ht 67.0 in | Wt 259.0 lb

## 2022-10-30 DIAGNOSIS — Z0000 Encounter for general adult medical examination without abnormal findings: Secondary | ICD-10-CM | POA: Diagnosis not present

## 2022-10-30 NOTE — Progress Notes (Signed)
Subjective:   Luis Clark is a 72 y.o. male who presents for Medicare Annual/Subsequent preventive examination.  Visit Complete: Virtual  I connected with  Luis Clark on 10/30/22 by a audio enabled telemedicine application and verified that I am speaking with the correct person using two identifiers.  Patient Location: Home  Provider Location: Office/Clinic  I discussed the limitations of evaluation and management by telemedicine. The patient expressed understanding and agreed to proceed.  Review of Systems     Cardiac Risk Factors include: advanced age (>32men, >60 women);hypertension;male gender;obesity (BMI >30kg/m2)     Objective:    Today's Vitals   10/30/22 1259 10/30/22 1318  Weight:  259 lb (117.5 kg)  Height:  5\' 7"  (1.702 m)  PainSc: 4     Body mass index is 40.57 kg/m.     10/30/2022    1:08 PM 11/16/2021   12:24 PM 10/27/2021    9:06 AM 09/10/2020    8:00 AM 09/07/2020    9:53 AM  Advanced Directives  Does Patient Have a Medical Advance Directive? No Yes Yes Yes Yes  Type of Science writer of [employer] Power of [place];Living will Healthcare Power of [place];Living will  Does patient want to make changes to medical advance directive?    No - Patient declined   Copy of Healthcare Power of Attorney in Chart?   Yes - validated most recent copy scanned in chart (See row information) No - copy requested No - copy requested  Would patient like information on creating a medical advance directive? No - Patient declined        Current Medications (verified) Outpatient Encounter Medications as of 10/30/2022  Medication Sig   amLODipine (NORVASC) 10 MG tablet Take 1 tablet (10 mg total) by mouth daily.   atorvastatin (LIPITOR) 20 MG tablet Take 20 mg by mouth daily.   DULoxetine (CYMBALTA) 60 MG capsule TAKE 1 CAPSULE BY MOUTH  DAILY   gabapentin (NEURONTIN) 400 MG capsule TAKE 1 CAPSULE BY MOUTH 3  TIMES  DAILY   LUMIGAN 0.01 % SOLN Place 1 drop into both eyes at bedtime.    Multiple Vitamin (MULTIVITAMIN) tablet Take 1 tablet by mouth daily.   omeprazole (PRILOSEC) 20 MG capsule Take 20 mg by mouth daily.   tamsulosin (FLOMAX) 0.4 MG CAPS capsule Take 0.4 mg by mouth daily.   tiZANidine (ZANAFLEX) 4 MG tablet Take 1 tablet (4 mg total) by mouth every 6 (six) hours as needed for muscle spasms.   VIAGRA 50 MG tablet Take 50 mg by mouth daily as needed for erectile dysfunction.   No facility-administered encounter medications on file as of 10/30/2022.    Allergies (verified) Penicillin g and Penicillins   History: Past Medical History:  Diagnosis Date   Cataract    Enlarged prostate    GERD (gastroesophageal reflux disease)    HLD (hyperlipidemia)    Hypertension    Neuropathic pain    Thoracic stenosis    Past Surgical History:  Procedure Laterality Date   BACK SURGERY     COLONOSCOPY WITH PROPOFOL N/A 11/16/2021   Procedure: COLONOSCOPY WITH PROPOFOL;  Surgeon: Wyline Mood, MD;  [facility]COPY;  Service: Gastroenterology;  Laterality: N/A;   KNEE SURGERY     LUMBAR LAMINECTOMY/DECOMPRESSION MICRODISCECTOMY N/A 09/09/2020   Procedure: Thoracic eleven-twelve Laminectomy;  Surgeon: Coletta Memos, MD;  Location: [facility] OR;  Service: Neurosurgery;  Laterality: N/A;   History reviewed. No  pertinent family history. Social History   Socioeconomic History   Marital status: Legally Separated    Spouse name: Not on file   Number of children: Not on file   Years of education: Not on file   Highest education level: Not on file  Occupational History   Not on file  Tobacco Use   Smoking status: Former    Years: 40    Types: Cigarettes    Quit date: 2014    Years since quitting: 10.4   Smokeless tobacco: Never  Vaping Use   Vaping Use: Never used  Substance and Sexual Activity   Alcohol use: Yes    Alcohol/week: 3.0 - 4.0 standard drinks of alcohol    Types: 3 - 4  Cans of beer per week   Drug use: Not Currently   Sexual activity: Yes  Other Topics Concern   Not on file  Social History Narrative   Not on file   Social Determinants of Health   Financial Resource Strain: Low Risk  (10/30/2022)   Overall Financial Resource Strain (CARDIA)    Difficulty of Paying Living Expenses: Not hard at all  Food Insecurity: No Food Insecurity (10/30/2022)   Hunger Vital Sign    Worried About Running Out of Food in the Last Year: Never true    Ran Out of Food in the Last Year: Never true  Transportation Needs: No Transportation Needs (10/30/2022)   PRAPARE - Administrator, Civil Service (Medical): No    Lack of Transportation (Non-Medical): No  Physical Activity: Insufficiently Active (10/30/2022)   Exercise Vital Sign    Days of Exercise per Week: 4 days    Minutes of Exercise per Session: 30 min  Stress: No Stress Concern Present (10/30/2022)   [employer] of Occupational Health - Occupational Stress Questionnaire    Feeling of Stress : Only a little  Social Connections: Moderately Isolated (10/30/2022)   Social Connection and Isolation Panel [NHANES]    Frequency of Communication with Friends and Family: Once a week    Frequency of Social Gatherings with Friends and Family: Once a week    Attends Religious Services: More than 4 times per year    Active Member of [employer] or Organizations: Yes    Attends Banker Meetings: Never    Marital Status: Widowed    Tobacco Counseling Counseling given: Not Answered   Clinical Intake:  Pre-visit preparation completed: Yes  Pain : 0-10 Pain Score: 4  Pain Type: Neuropathic pain Pain Location: Leg     Nutritional Risks: None Diabetes: No  How often do you need to have someone help you when you read instructions, pamphlets, or other written materials from your doctor or pharmacy?: 1 - Never  Interpreter Needed?: No  Information entered by :: Kennedy Bucker,  LPN   Activities of Daily Living    10/30/2022    1:00 PM  In your present state of health, do you have any difficulty performing the following activities:  Hearing? 0  Vision? 0  Difficulty concentrating or making decisions? 0  Walking or climbing stairs? 1  Comment neuropathy  Dressing or bathing? 1  Comment hard to get in and out of tub  Doing errands, shopping? 0  Preparing Food and eating ? N  Using the Toilet? N  In the past six months, have you accidently leaked urine? N  Do you have problems with loss of bowel control? N  Managing your Medications? N  Managing  your Finances? N  Housekeeping or managing your Housekeeping? N    Patient Care Team: Larae Grooms, NP as PCP - General (Nurse Practitioner) Ranee Gosselin, MD as Referring Physician (Orthopedic Surgery)  Indicate any recent Medical Services you may have received from other than [facility] providers in the past year (date may be approximate).     Assessment:   This is a routine wellness examination for [facility].  Hearing/Vision screen Hearing Screening - Comments:: No aids Vision Screening - Comments:: Wears glasses- Dr. Dione Booze   Dietary issues and exercise activities discussed:     Goals Addressed             This Visit's Progress    DIET - EAT MORE FRUITS AND VEGETABLES         Depression Screen    10/30/2022    1:06 PM 11/22/2021    8:26 AM 10/27/2021    9:20 AM  PHQ 2/9 Scores  PHQ - 2 Score 2 1 0  PHQ- 9 Score 4 4 4     Fall Risk    10/30/2022    1:09 PM 11/22/2021    8:25 AM 10/27/2021    9:21 AM  Fall Risk   Falls in the past year? 1 1 0  Number falls in past yr: 1 0 0  Injury with Fall? 0 0 0  Risk for fall due to : History of fall(s);Impaired balance/gait History of fall(s)   Follow up Falls evaluation completed;Falls prevention discussed Falls evaluation completed Education provided;Falls prevention discussed;Falls evaluation completed    MEDICARE RISK AT HOME:  Medicare Risk  at Home - 10/30/22 1309     Any stairs in or around the home? Yes    If so, are there any without handrails? No    Home free of loose throw rugs in walkways, pet beds, electrical cords, etc? Yes    Adequate lighting in your home to reduce risk of falls? Yes    Life alert? No    Use of a cane, walker or w/c? Yes   cane when goes out   Grab bars in the bathroom? No    Shower chair or bench in shower? No    Elevated toilet seat or a handicapped toilet? No             TIMED UP AND GO:  Was the test performed?  No    Cognitive Function:        10/30/2022    1:11 PM 10/27/2021    9:08 AM  6CIT Screen  What Year? 0 points 0 points  What month? 0 points 0 points  What time? 0 points 0 points  Count back from 20 0 points 0 points  Months in reverse 2 points 0 points  Repeat phrase 0 points 0 points  Total Score 2 points 0 points    Immunizations Immunization History  Administered Date(s) Administered   Influenza, High Dose Seasonal PF 03/09/2015, 04/08/2015   Influenza, Seasonal, Injecte, Preservative Fre 03/12/2012   Influenza,inj,Quad PF,6+ Mos 06/21/2016   Influenza-Unspecified 02/05/2010, 02/27/2011, 06/08/2014   PNEUMOCOCCAL CONJUGATE-20 11/22/2021   Pneumococcal Conjugate-13 08/21/2014   Pneumococcal Polysaccharide-23 06/21/2016   Tdap 06/30/2011, 11/22/2021    TDAP status: Up to date  Flu Vaccine status: Declined, Education has been provided regarding the importance of this vaccine but patient still declined. Advised may receive this vaccine at local pharmacy or Health Dept. Aware to provide a copy of the vaccination record if obtained from local pharmacy or  Health Dept. Verbalized acceptance and understanding.  Pneumococcal vaccine status: Up to date  Covid-19 vaccine status: Declined, Education has been provided regarding the importance of this vaccine but patient still declined. Advised may receive this vaccine at local pharmacy or Health Dept.or vaccine  clinic. Aware to provide a copy of the vaccination record if obtained from local pharmacy or Health Dept. Verbalized acceptance and understanding.  Qualifies for Shingles Vaccine? Yes   Zostavax completed No   Shingrix Completed?: No.    Education has been provided regarding the importance of this vaccine. Patient has been advised to call insurance company to determine out of pocket expense if they have not yet received this vaccine. Advised may also receive vaccine at local pharmacy or Health Dept. Verbalized acceptance and understanding.  Screening Tests Health Maintenance  Topic Date Due   COVID-19 Vaccine (1) Never done   Zoster Vaccines- Shingrix (1 of 2) Never done   INFLUENZA VACCINE  12/07/2022   Medicare Annual Wellness (AWV)  10/30/2023   Colonoscopy  11/17/2031   DTaP/Tdap/Td (3 - Td or Tdap) 11/23/2031   Pneumonia Vaccine 32+ Years old  Completed   Hepatitis C Screening  Completed   HPV VACCINES  Aged Out    Health Maintenance  Health Maintenance Due  Topic Date Due   COVID-19 Vaccine (1) Never done   Zoster Vaccines- Shingrix (1 of 2) Never done    Colorectal cancer screening: Type of screening: Colonoscopy. Completed 11/16/21. Repeat every 10 years- WILL HAVE AGED OUT  Lung Cancer Screening: (Low Dose CT Chest recommended if Age 30-80 years, 20 pack-year currently smoking OR have quit w/in 15years.) does not qualify.    Additional Screening:  Hepatitis C Screening: does qualify; Completed 05/26/21  Vision Screening: Recommended annual ophthalmology exams for early detection of glaucoma and other disorders of the eye. Is the patient up to date with their annual eye exam?  Yes  Who is the provider or what is the name of the office in which the patient attends annual eye exams? Dr. Dione Booze If pt is not established with a provider, would they like to be referred to a provider to establish care? No .   Dental Screening: Recommended annual dental exams for proper oral  hygiene   Community Resource Referral / Chronic Care Management: CRR required this visit?  No   CCM required this visit?  No     Plan:     I have personally reviewed and noted the following in the patient's chart:   Medical and social history Use of alcohol, tobacco or illicit drugs  Current medications and supplements including opioid prescriptions. Patient is not currently taking opioid prescriptions. Functional ability and status Nutritional status Physical activity Advanced directives List of other physicians Hospitalizations, surgeries, and ER visits in previous 12 months Vitals Screenings to include cognitive, depression, and falls Referrals and appointments  In addition, I have reviewed and discussed with patient certain preventive protocols, quality metrics, and best practice recommendations. A written personalized care plan for preventive services as well as general preventive health recommendations were provided to patient.     Hal Hope, LPN   1/61/0960   After Visit Summary: (Declined) Due to this being a telephonic visit, with patients personalized plan was offered to patient but patient Declined AVS at this time   Nurse Notes: none

## 2022-10-30 NOTE — Patient Instructions (Signed)
Mr. Luis Clark , Thank you for taking time to come for your Medicare Wellness Visit. I appreciate your ongoing commitment to your health goals. Please review the following plan we discussed and let me know if I can assist you in the future.   These are the goals we discussed:  Goals      DIET - EAT MORE FRUITS AND VEGETABLES     Patient Stated     Would like to be able walk better        This is a list of the screening recommended for you and due dates:  Health Maintenance  Topic Date Due   COVID-19 Vaccine (1) Never done   Zoster (Shingles) Vaccine (1 of 2) Never done   Flu Shot  12/07/2022   Medicare Annual Wellness Visit  10/30/2023   Colon Cancer Screening  11/17/2031   DTaP/Tdap/Td vaccine (3 - Td or Tdap) 11/23/2031   Pneumonia Vaccine  Completed   Hepatitis C Screening  Completed   HPV Vaccine  Aged Out    Advanced directives: no  Conditions/risks identified: none  Next appointment: Follow up in one year for your annual wellness visit. 11/05/23 @ 10:15 am by phone  Preventive Care 65 Years and Older, Male  Preventive care refers to lifestyle choices and visits with your health care provider that can promote health and wellness. What does preventive care include? A yearly physical exam. This is also called an annual well check. Dental exams once or twice a year. Routine eye exams. Ask your health care provider how often you should have your eyes checked. Personal lifestyle choices, including: Daily care of your teeth and gums. Regular physical activity. Eating a healthy diet. Avoiding tobacco and drug use. Limiting alcohol use. Practicing safe sex. Taking low doses of aspirin every day. Taking vitamin and mineral supplements as recommended by your health care provider. What happens during an annual well check? The services and screenings done by your health care provider during your annual well check will depend on your age, overall health, lifestyle risk factors,  and family history of disease. Counseling  Your health care provider may ask you questions about your: Alcohol use. Tobacco use. Drug use. Emotional well-being. Home and relationship well-being. Sexual activity. Eating habits. History of falls. Memory and ability to understand (cognition). Work and work Astronomer. Screening  You may have the following tests or measurements: Height, weight, and BMI. Blood pressure. Lipid and cholesterol levels. These may be checked every 5 years, or more frequently if you are over 11 years old. Skin check. Lung cancer screening. You may have this screening every year starting at age 57 if you have a 30-pack-year history of smoking and currently smoke or have quit within the past 15 years. Fecal occult blood test (FOBT) of the stool. You may have this test every year starting at age 72. Flexible sigmoidoscopy or colonoscopy. You may have a sigmoidoscopy every 5 years or a colonoscopy every 10 years starting at age 37. Prostate cancer screening. Recommendations will vary depending on your family history and other risks. Hepatitis C blood test. Hepatitis B blood test. Sexually transmitted disease (STD) testing. Diabetes screening. This is done by checking your blood sugar (glucose) after you have not eaten for a while (fasting). You may have this done every 1-3 years. Abdominal aortic aneurysm (AAA) screening. You may need this if you are a current or former smoker. Osteoporosis. You may be screened starting at age 19 if you are at high  risk. Talk with your health care provider about your test results, treatment options, and if necessary, the need for more tests. Vaccines  Your health care provider may recommend certain vaccines, such as: Influenza vaccine. This is recommended every year. Tetanus, diphtheria, and acellular pertussis (Tdap, Td) vaccine. You may need a Td booster every 10 years. Zoster vaccine. You may need this after age  57. Pneumococcal 13-valent conjugate (PCV13) vaccine. One dose is recommended after age 44. Pneumococcal polysaccharide (PPSV23) vaccine. One dose is recommended after age 31. Talk to your health care provider about which screenings and vaccines you need and how often you need them. This information is not intended to replace advice given to you by your health care provider. Make sure you discuss any questions you have with your health care provider. Document Released: 05/21/2015 Document Revised: 01/12/2016 Document Reviewed: 02/23/2015 Elsevier Interactive Patient Education  2017 [employer] Prevention in the Home Falls can cause injuries. They can happen to people of all ages. There are many things you can do to make your home safe and to help prevent falls. What can I do on the outside of my home? Regularly fix the edges of walkways and driveways and fix any cracks. Remove anything that might make you trip as you walk through a door, such as a raised step or threshold. Trim any bushes or trees on the path to your home. Use bright outdoor lighting. Clear any walking paths of anything that might make someone trip, such as rocks or tools. Regularly check to see if handrails are loose or broken. Make sure that both sides of any steps have handrails. Any raised decks and porches should have guardrails on the edges. Have any leaves, snow, or ice cleared regularly. Use sand or salt on walking paths during winter. Clean up any spills in your garage right away. This includes oil or grease spills. What can I do in the bathroom? Use night lights. Install grab bars by the toilet and in the tub and shower. Do not use towel bars as grab bars. Use non-skid mats or decals in the tub or shower. If you need to sit down in the shower, use a plastic, non-slip stool. Keep the floor dry. Clean up any water that spills on the floor as soon as it happens. Remove soap buildup in the tub or shower  regularly. Attach bath mats securely with double-sided non-slip rug tape. Do not have throw rugs and other things on the floor that can make you trip. What can I do in the bedroom? Use night lights. Make sure that you have a light by your bed that is easy to reach. Do not use any sheets or blankets that are too big for your bed. They should not hang down onto the floor. Have a firm chair that has side arms. You can use this for support while you get dressed. Do not have throw rugs and other things on the floor that can make you trip. What can I do in the kitchen? Clean up any spills right away. Avoid walking on wet floors. Keep items that you use a lot in easy-to-reach places. If you need to reach something above you, use a strong step stool that has a grab bar. Keep electrical cords out of the way. Do not use floor polish or wax that makes floors slippery. If you must use wax, use non-skid floor wax. Do not have throw rugs and other things on the floor that can  make you trip. What can I do with my stairs? Do not leave any items on the stairs. Make sure that there are handrails on both sides of the stairs and use them. Fix handrails that are broken or loose. Make sure that handrails are as long as the stairways. Check any carpeting to make sure that it is firmly attached to the stairs. Fix any carpet that is loose or worn. Avoid having throw rugs at the top or bottom of the stairs. If you do have throw rugs, attach them to the floor with carpet tape. Make sure that you have a light switch at the top of the stairs and the bottom of the stairs. If you do not have them, ask someone to add them for you. What else can I do to help prevent falls? Wear shoes that: Do not have high heels. Have rubber bottoms. Are comfortable and fit you well. Are closed at the toe. Do not wear sandals. If you use a stepladder: Make sure that it is fully opened. Do not climb a closed stepladder. Make sure that  both sides of the stepladder are locked into place. Ask someone to hold it for you, if possible. Clearly mark and make sure that you can see: Any grab bars or handrails. First and last steps. Where the edge of each step is. Use tools that help you move around (mobility aids) if they are needed. These include: Canes. Walkers. Scooters. Crutches. Turn on the lights when you go into a dark area. Replace any light bulbs as soon as they burn out. Set up your furniture so you have a clear path. Avoid moving your furniture around. If any of your floors are uneven, fix them. If there are any pets around you, be aware of where they are. Review your medicines with your doctor. Some medicines can make you feel dizzy. This can increase your chance of falling. Ask your doctor what other things that you can do to help prevent falls. This information is not intended to replace advice given to you by your health care provider. Make sure you discuss any questions you have with your health care provider. Document Released: 02/18/2009 Document Revised: 09/30/2015 Document Reviewed: 05/29/2014 Elsevier Interactive Patient Education  2017 [employer].

## 2022-11-02 ENCOUNTER — Telehealth: Payer: Self-pay | Admitting: *Deleted

## 2022-11-02 NOTE — Telephone Encounter (Signed)
  Chief Complaint: Med Information Symptoms: NA Frequency: NA Pertinent Negatives: Patient denies NA Disposition: [] ED /[] Urgent Care (no appt availability in office) / [] Appointment(In office/virtual)/ []  [place] [facility]/ [] Home Care/ [] Refused Recommended Disposition /[] [place] [facility]/ []  Follow-up with PCP Additional Notes:  Pt calling from dentist office, needs extraction. Calling for list of meds. Information provided of meds on current med profile, reviewed with dental assistant.

## 2022-11-07 ENCOUNTER — Ambulatory Visit: Payer: Self-pay | Admitting: *Deleted

## 2022-11-07 ENCOUNTER — Other Ambulatory Visit: Payer: Self-pay | Admitting: *Deleted

## 2022-11-07 NOTE — Telephone Encounter (Signed)
Summary: med ?   Pt called in wanting to know proper name for Viagara for pharmacy.       Chief Complaint: requesting refill or new medication Rx sent to pharmacy.  Symptoms: has not been receiving medication through mail service pharmacy due to not being ordered correctly Frequency: na  Pertinent Negatives: Patient denies na Disposition: [] ED /[] Urgent Care (no appt availability in office) / [] Appointment(In office/virtual)/ []  [place] [facility]/ [] Home Care/ [] Refused Recommended Disposition /[] [place] [facility]/ [x]  Follow-up with PCP Additional Notes:   Requesting if PCP can order viagra medication. Patient will come in for appt but is unsure which provider needs to be seen due to PCP is going out on leave. Patient concerned appt on 12/04/22 with [name] is supposed to be with his twin brother Luis Clark DOB 1951-03-11 instead of him. Please advise if appt can be made for annual physical after 11/23/22 with Luis Prime, PA covering for PCP. Please advise and patient requesting a call back for clarification.       Reason for Disposition  Prescription request for new medicine (not a refill)  Answer Assessment - Initial Assessment Questions 1. NAME of MEDICINE: "What medicine(s) are you calling about?"     viagra 2. QUESTION: "What is your question?" (e.g., double dose of medicine, side effect)     Can another Rx be written for viagra with "different " name for pharmacy?  3. PRESCRIBER: "Who prescribed the medicine?" Reason: if prescribed by specialist, call should be referred to that group.     Historical provider 4. SYMPTOMS: "Do you have any symptoms?" If Yes, ask: "What symptoms are you having?"  "How bad are the symptoms (e.g., mild, moderate, severe)     na 5. PREGNANCY:  "Is there any chance that you are pregnant?" "When was your last menstrual period?"     na  Protocols used: Medication Question Call-A-AH

## 2022-11-07 NOTE — Telephone Encounter (Signed)
RX t'd up for provider.

## 2022-11-07 NOTE — Telephone Encounter (Signed)
Patient hasn't been seen in over a year. He will need to wait until he is seen for a prescription.

## 2022-11-07 NOTE — Telephone Encounter (Signed)
Called and explained to patient that his provider was different from his brother's provider.  Both brothers are on each other's DPRs.  He thought since he was seeing Rashelle on 12/04/2022 that his appointments had got mixed up with his twin.  I explained to him that it is his appointment as Rhona Raider is seeing Karen's patients in her absence.  Pt verbalized understanding but is still wanting a refill on Viagra.

## 2022-11-19 ENCOUNTER — Other Ambulatory Visit: Payer: Self-pay | Admitting: Nurse Practitioner

## 2022-11-20 NOTE — Telephone Encounter (Signed)
Requested Prescriptions  Pending Prescriptions Disp Refills   gabapentin (NEURONTIN) 400 MG capsule [Pharmacy Med Name: Gabapentin 400 MG Oral Capsule] 300 capsule 0    Sig: TAKE 1 CAPSULE BY MOUTH 3 TIMES  DAILY     Neurology: Anticonvulsants - gabapentin Failed - 11/19/2022 10:50 PM      Failed - Cr in normal range and within 360 days    Creatinine, Ser  Date Value Ref Range Status  11/22/2021 1.00 0.76 - 1.27 mg/dL Final         Passed - Completed PHQ-2 or PHQ-9 in the last 360 days      Passed - Valid encounter within last 12 months    Recent Outpatient Visits           12 months ago Annual physical exam   [place] [facility] Larae Grooms, NP   1 year ago Left hemiparesis [facility])   [place] [facility] Larae Grooms, NP       Future Appointments             In 2 weeks Pearley, Sherran Needs, NP [place] [facility]

## 2022-12-04 ENCOUNTER — Ambulatory Visit (INDEPENDENT_AMBULATORY_CARE_PROVIDER_SITE_OTHER): Payer: 59 | Admitting: Family Medicine

## 2022-12-04 ENCOUNTER — Encounter: Payer: Self-pay | Admitting: Family Medicine

## 2022-12-04 VITALS — BP 123/78 | HR 89 | Temp 98.4°F | Ht 67.7 in | Wt 266.2 lb

## 2022-12-04 DIAGNOSIS — R739 Hyperglycemia, unspecified: Secondary | ICD-10-CM | POA: Diagnosis not present

## 2022-12-04 DIAGNOSIS — H401131 Primary open-angle glaucoma, bilateral, mild stage: Secondary | ICD-10-CM | POA: Insufficient documentation

## 2022-12-04 DIAGNOSIS — I10 Essential (primary) hypertension: Secondary | ICD-10-CM

## 2022-12-04 DIAGNOSIS — Z01818 Encounter for other preprocedural examination: Secondary | ICD-10-CM

## 2022-12-04 DIAGNOSIS — E782 Mixed hyperlipidemia: Secondary | ICD-10-CM

## 2022-12-04 LAB — BAYER DCA HB A1C WAIVED: HB A1C (BAYER DCA - WAIVED): 5.6 % (ref 4.8–5.6)

## 2022-12-04 MED ORDER — AMLODIPINE BESYLATE 10 MG PO TABS: 10.0000 mg | ORAL_TABLET | Freq: Every day | ORAL | 4 refills | Status: DC

## 2022-12-04 MED ORDER — ATORVASTATIN CALCIUM 20 MG PO TABS: 20.0000 mg | ORAL_TABLET | Freq: Every day | ORAL | 1 refills | Status: DC

## 2022-12-04 MED ORDER — VIAGRA 50 MG PO TABS: 50.0000 mg | ORAL_TABLET | Freq: Every day | ORAL | 12 refills | Status: DC | PRN

## 2022-12-04 NOTE — Assessment & Plan Note (Addendum)
Chronic.  Controlled.  Continue with current medication regimen of Atorvastatin 20mg  daily, refills sent.  Labs ordered today.  Return to clinic in 6 months for reevaluation.  Call sooner if concerns arise.

## 2022-12-04 NOTE — Assessment & Plan Note (Addendum)
Chronic.  Controlled.  Continue with current medication regimen of Amlodipine 10 mg, refills sent.  Labs ordered today.  Return to clinic in 3 months for reevaluation.  Call sooner if concerns arise.

## 2022-12-04 NOTE — Progress Notes (Signed)
BP 123/78 (BP Location: Left Arm, Cuff Size: Large)   Pulse 89   Temp 98.4 F (36.9 C) (Oral)   Ht 5' 7.7" (1.72 m)   Wt 266 lb 3.2 oz (120.7 kg)   SpO2 94%   BMI 40.84 kg/m    Subjective:    Patient ID: Luis Clark, male    DOB: [birth year], 72 y.o.   MRN: [id number]  HPI: Luis Clark is a 72 y.o. male  Chief Complaint  Patient presents with   Hyperlipidemia   Hypertension   Medical Clearance    Pt states he is needing clearance for an upcoming procedure. No EKG is needed per [facility], form in room with the patient.    He is here today for clearance for a dental extraction of all teeth and measurement for dentures.   HYPERTENSION / HYPERLIPIDEMIA Taking Atorvastatin 20 mg and Amlodipine 10 mg daily Satisfied with current treatment? yes Duration of hypertension: chronic BP monitoring frequency: a few times a week x2 weekly BP range: 144/70 BP medication side effects: no Duration of hyperlipidemia: chronic Cholesterol medication side effects: no Cholesterol supplements: none Medication compliance: excellent compliance Aspirin: no Recent stressors: no Recurrent headaches: no Visual changes: no Palpitations: no Dyspnea: no Chest pain: no Lower extremity edema: no Dizzy/lightheaded: no   Relevant past medical, surgical, family and social history reviewed and updated as indicated. Interim medical history since our last visit reviewed. Allergies and medications reviewed and updated.  Review of Systems  Constitutional:  Negative for fever.  HENT: Negative.    Eyes:  Negative for visual disturbance.  Respiratory: Negative.    Cardiovascular: Negative.   Gastrointestinal: Negative.   Genitourinary: Negative.   Musculoskeletal:  Negative for myalgias.  Skin: Negative.   Neurological:  Negative for dizziness, syncope, light-headedness and headaches.  Hematological: Negative.   Psychiatric/Behavioral: Negative.      Per HPI unless specifically indicated  above     Objective:    BP 123/78 (BP Location: Left Arm, Cuff Size: Large)   Pulse 89   Temp 98.4 F (36.9 C) (Oral)   Ht 5' 7.7" (1.72 m)   Wt 266 lb 3.2 oz (120.7 kg)   SpO2 94%   BMI 40.84 kg/m   Wt Readings from Last 3 Encounters:  12/04/22 266 lb 3.2 oz (120.7 kg)  10/30/22 259 lb (117.5 kg)  11/30/21 265 lb (120.2 kg)    Physical Exam Vitals and nursing note reviewed.  Constitutional:      General: He is awake. He is not in acute distress.    Appearance: Normal appearance. He is well-developed and well-groomed. He is morbidly obese. He is not ill-appearing.  HENT:     Head: Normocephalic and atraumatic.     Right Ear: Hearing and external ear normal. No drainage.     Left Ear: Hearing and external ear normal. No drainage.     Nose: Nose normal.  Eyes:     General: Lids are normal.        Right eye: No discharge.        Left eye: No discharge.     Conjunctiva/sclera: Conjunctivae normal.  Cardiovascular:     Rate and Rhythm: Normal rate and regular rhythm.     Pulses:          Radial pulses are 2+ on the right side and 2+ on the left side.       Posterior tibial pulses are 2+ on the right side and  2+ on the left side.     Heart sounds: Normal heart sounds, S1 normal and S2 normal. No murmur heard.    No gallop.  Pulmonary:     Effort: Pulmonary effort is normal. No accessory muscle usage or respiratory distress.     Breath sounds: Normal breath sounds.  Musculoskeletal:        General: Normal range of motion.     Cervical back: Full passive range of motion without pain and normal range of motion.     Right lower leg: No edema.     Left lower leg: No edema.  Skin:    General: Skin is warm and dry.     Capillary Refill: Capillary refill takes less than 2 seconds.  Neurological:     Mental Status: He is alert and oriented to person, place, and time.     Gait: Gait abnormal.     Comments: Uses cane  Psychiatric:        Attention and Perception: Attention  normal.        Mood and Affect: Mood normal.        Speech: Speech normal.        Behavior: Behavior normal. Behavior is cooperative.        Thought Content: Thought content normal.     Results for orders placed or performed in visit on 11/22/21  TSH  Result Value Ref Range   TSH 1.680 0.450 - 4.500 uIU/mL  PSA  Result Value Ref Range   Prostate Specific Ag, Serum 1.7 0.0 - 4.0 ng/mL  Lipid panel  Result Value Ref Range   Cholesterol, Total 131 100 - 199 mg/dL   Triglycerides 258 (H) 0 - 149 mg/dL   HDL 34 (L) >52 mg/dL   VLDL Cholesterol Cal 29 5 - 40 mg/dL   LDL Chol Calc (NIH) 68 0 - 99 mg/dL   Chol/HDL Ratio 3.9 0.0 - 5.0 ratio  CBC with Differential/Platelet  Result Value Ref Range   WBC 5.2 3.4 - 10.8 x10E3/uL   RBC 5.90 (H) 4.14 - 5.80 x10E6/uL   Hemoglobin 13.3 13.0 - 17.7 g/dL   Hematocrit 77.8 24.2 - 51.0 %   MCV 74 (L) 79 - 97 fL   MCH 22.5 (L) 26.6 - 33.0 pg   MCHC 30.6 (L) 31.5 - 35.7 g/dL   RDW 35.3 61.4 - 43.1 %   Platelets 195 150 - 450 x10E3/uL   Neutrophils 44 Not Estab. %   Lymphs 40 Not Estab. %   Monocytes 9 Not Estab. %   Eos 7 Not Estab. %   Basos 0 Not Estab. %   Neutrophils Absolute 2.3 1.4 - 7.0 x10E3/uL   Lymphocytes Absolute 2.1 0.7 - 3.1 x10E3/uL   Monocytes Absolute 0.5 0.1 - 0.9 x10E3/uL   EOS (ABSOLUTE) 0.3 0.0 - 0.4 x10E3/uL   Basophils Absolute 0.0 0.0 - 0.2 x10E3/uL   Immature Granulocytes 0 Not Estab. %   Immature Grans (Abs) 0.0 0.0 - 0.1 x10E3/uL  Comprehensive metabolic panel  Result Value Ref Range   Glucose 104 (H) 70 - 99 mg/dL   BUN 14 8 - 27 mg/dL   Creatinine, Ser 5.40 0.76 - 1.27 mg/dL   eGFR 80 >08 QP/YPP/5.09   BUN/Creatinine Ratio 14 10 - 24   Sodium 137 134 - 144 mmol/L   Potassium 4.6 3.5 - 5.2 mmol/L   Chloride 102 96 - 106 mmol/L   CO2 21 20 - 29 mmol/L   Calcium 9.2  8.6 - 10.2 mg/dL   Total Protein 7.3 6.0 - 8.5 g/dL   Albumin 4.1 3.8 - 4.8 g/dL   Globulin, Total 3.2 1.5 - 4.5 g/dL   Albumin/Globulin  Ratio 1.3 1.2 - 2.2   Bilirubin Total 0.4 0.0 - 1.2 mg/dL   Alkaline Phosphatase 77 44 - 121 IU/L   AST 27 0 - 40 IU/L   ALT 23 0 - 44 IU/L  Urinalysis, Routine w reflex microscopic  Result Value Ref Range   Specific Gravity, UA 1.025 1.005 - 1.030   pH, UA 6.0 5.0 - 7.5   Color, UA Yellow Yellow   Appearance Ur Clear Clear   Leukocytes,UA Negative Negative   Protein,UA Negative Negative/Trace   Glucose, UA Negative Negative   Ketones, UA Negative Negative   RBC, UA Negative Negative   Bilirubin, UA Negative Negative   Urobilinogen, Ur 1.0 0.2 - 1.0 mg/dL   Nitrite, UA Negative Negative      Assessment & Plan:   Problem List Items Addressed This Visit     Hypertension - Primary    Chronic.  Controlled.  Continue with current medication regimen of Amlodipine 10 mg, refills sent.  Labs ordered today.  Return to clinic in 3 months for reevaluation.  Call sooner if concerns arise.        Relevant Medications   VIAGRA 50 MG tablet   atorvastatin (LIPITOR) 20 MG tablet   amLODipine (NORVASC) 10 MG tablet   Other Relevant Orders   Lipid Panel w/o Chol/HDL Ratio   Comp Met (CMET)   CBC With Diff/Platelet   HLD (hyperlipidemia)    Chronic.  Controlled.  Continue with current medication regimen of Atorvastatin 20mg  daily, refills sent.  Labs ordered today.  Return to clinic in 6 months for reevaluation.  Call sooner if concerns arise.        Relevant Medications   VIAGRA 50 MG tablet   atorvastatin (LIPITOR) 20 MG tablet   amLODipine (NORVASC) 10 MG tablet   Other Relevant Orders   Lipid Panel w/o Chol/HDL Ratio   Other Visit Diagnoses     Hyperglycemia       A1C and CMP done today.   Relevant Orders   Bayer DCA Hb A1c Waived   Pre-procedural examination       Relevant Orders   Comp Met (CMET)   CBC With Diff/Platelet   INR/PT   EKG 12-Lead (Completed)        Follow up plan: Return in about 6 months (around 06/06/2023) for HTN/HLD.

## 2022-12-05 DIAGNOSIS — I10 Essential (primary) hypertension: Secondary | ICD-10-CM | POA: Diagnosis not present

## 2022-12-07 NOTE — Progress Notes (Signed)
Hi Luis Clark, your electrolytes, liver function, kidney function all came back normal. There were no signs of anemia. Your cholesterol levels came back slightly elevated, I recommend continue taking Atorvastatin 20 mg daily. We will fax your medical clearance form for your dental procedure. Thank you for allowing me to participate in your care.

## 2023-01-01 ENCOUNTER — Telehealth: Payer: Self-pay

## 2023-01-01 NOTE — Telephone Encounter (Signed)
FYI below  Copied from CRM [id number]. Topic: General - Inquiry >> Jan 01, 2023 10:48 AM De [place] wrote: Reason for CRM: Pt stated he needs to renew his handicapped sticker. He stated he will drop off paperwork to be filled out by the provider.  Please advise.

## 2023-01-09 DIAGNOSIS — H1045 Other chronic allergic conjunctivitis: Secondary | ICD-10-CM | POA: Diagnosis not present

## 2023-01-09 DIAGNOSIS — H40023 Open angle with borderline findings, high risk, bilateral: Secondary | ICD-10-CM | POA: Diagnosis not present

## 2023-01-09 DIAGNOSIS — H2513 Age-related nuclear cataract, bilateral: Secondary | ICD-10-CM | POA: Diagnosis not present

## 2023-01-17 ENCOUNTER — Ambulatory Visit: Payer: Self-pay

## 2023-01-17 NOTE — Telephone Encounter (Signed)
Chief Complaint: Right foot pain  Symptoms: Right great toe joint pain 9/10, red, warm to touch near joint Frequency: Constant Pertinent Negatives: Patient denies fever, injury  Disposition: [] ED /[] Urgent Care (no appt availability in office) / [x] Appointment(In office/virtual)/ []  [place] [facility]/ [] Home Care/ [] Refused Recommended Disposition /[]  Mobile Bus/ []  Follow-up with PCP Additional Notes: Patient reports right hallux joint pain and stated it began yesterday. Patient reports pain 9/10, warm to touch, and redness. Patient states he does not have a history of gout but believes this may be the cause of the pain. Patient stated he tried over the counter pain medication but it has not improved his symptoms. Care advice was given and patient decline appointment today. Patient has been scheduled for evaluation tomorrow at 0920. Advised to call back if symptoms got worse. Patient verbalized understanding.  Reason for Disposition  [1] SEVERE pain (e.g., excruciating, unable to do any normal activities) AND [2] not improved after 2 hours of pain medicine  Answer Assessment - Initial Assessment Questions 1. ONSET: "When did the pain start?"      Yesterday 2. LOCATION: "Where is the pain located?"      Right foot Great toe  3. PAIN: "How bad is the pain?"    (Scale 1-10; or mild, moderate, severe)  - MILD (1-3): doesn't interfere with normal activities.   - MODERATE (4-7): interferes with normal activities (e.g., work or school) or awakens from sleep, limping.   - SEVERE (8-10): excruciating pain, unable to do any normal activities, unable to walk.      9/10 4. WORK OR EXERCISE: "Has there been any recent work or exercise that involved this part of the body?"      No 5. CAUSE: "What do you think is causing the foot pain?"     I think it is Gout pain  6. OTHER SYMPTOMS: "Do you have any other symptoms?" (e.g., leg pain, rash, fever, numbness)     Swelling, joint pain, red  and warm to touch  Protocols used: Foot Pain-A-AH

## 2023-01-18 ENCOUNTER — Ambulatory Visit (INDEPENDENT_AMBULATORY_CARE_PROVIDER_SITE_OTHER): Payer: 59 | Admitting: Pediatrics

## 2023-01-18 ENCOUNTER — Encounter: Payer: Self-pay | Admitting: Pediatrics

## 2023-01-18 VITALS — BP 115/78 | HR 96 | Temp 98.5°F | Wt 265.6 lb

## 2023-01-18 DIAGNOSIS — M10071 Idiopathic gout, right ankle and foot: Secondary | ICD-10-CM | POA: Diagnosis not present

## 2023-01-18 MED ORDER — COLCHICINE 0.6 MG PO TABS: ORAL_TABLET | ORAL | 0 refills | Status: DC

## 2023-01-18 NOTE — Progress Notes (Signed)
   BP 115/78   Pulse 96   Temp 98.5 F (36.9 C) (Oral)   Wt 265 lb 9.6 oz (120.5 kg)   SpO2 97%   BMI 40.74 kg/m    Subjective:    Patient ID: Luis Clark, male    DOB: March 15, 1951, 72 y.o.   MRN: [id number]  HPI: Luis Clark is a 72 y.o. male  Chief Complaint  Patient presents with   Toe Pain    Pt states he has been having pain and swelling in his R big toe for the last 2 days. States he feels like his toe is throbbing.    #toe pain Started 2 days ago Has tried to cut meet out Had large shrimp and crawfish meal before this happened Has multiple family members with gout No fevers, chills, nausea, vomiting No rash Swelling and pain are getting worse Has not happened before  No trauma, no fall  Relevant past medical, surgical, family and social history reviewed and updated as indicated. Interim medical history since our last visit reviewed. Allergies and medications reviewed and updated.  ROS per HPI unless specifically indicated above     Objective:    BP 115/78   Pulse 96   Temp 98.5 F (36.9 C) (Oral)   Wt 265 lb 9.6 oz (120.5 kg)   SpO2 97%   BMI 40.74 kg/m   Wt Readings from Last 3 Encounters:  01/18/23 265 lb 9.6 oz (120.5 kg)  12/04/22 266 lb 3.2 oz (120.7 kg)  10/30/22 259 lb (117.5 kg)    Physical Exam: GEN: alert, cooperative, and in NAD HENT: atraumatic, normocephalic EYES: anicteric sclera  CV: hemodynamically stable RESP: breathing comfortably on room air EXT: warm and well perfused, R toe swollen and most tender at MTP joint, warm to touch, no rash or erythema, no skin abrasion, palpable strong pulses bilaterally  PSYCH: normal behavior and appropriate affect  Assessment & Plan:  Assessment & Plan   Manoah was seen today for toe pain.  Diagnoses and all orders for this visit:  Acute idiopathic gout involving toe of right foot Pain and swelling at MTP joint of right foot after large seafood meal. Suspect acute gout flare. Plan to  treat as below. Educated on low purine diet. Pt given strict return precautions and anticipatory guidance for ED.  -     colchicine 0.6 MG tablet; Take 2 tabs (1.2mg ) first, then another tab 1 hour later, continue to take 1-2 tabs daily until your symptoms resolve  Follow up plan: Return if symptoms worsen or fail to improve.  Shakirah Kirkey Howell Pringle, MD

## 2023-01-18 NOTE — Patient Instructions (Signed)
Colchicine: Take 2 tabs (1.2mg ) first, then another tab 1 hour later (0.6mg ), continue to take 1-2 tabs daily until your symptoms resolve  Please return if not getting better. If worsening over the weekend, please do not hesitate to seek medical attention.

## 2023-01-22 ENCOUNTER — Ambulatory Visit: Payer: Self-pay | Admitting: *Deleted

## 2023-01-22 ENCOUNTER — Telehealth: Payer: Self-pay | Admitting: Nurse Practitioner

## 2023-01-22 ENCOUNTER — Ambulatory Visit: Payer: Self-pay

## 2023-01-22 ENCOUNTER — Encounter: Payer: Self-pay | Admitting: Physician Assistant

## 2023-01-22 NOTE — Telephone Encounter (Signed)
Message from [place]away A sent at 01/22/2023 11:05 AM EDT  Summary: still having pain taking medicine   Pt states that his medication is not strong enough for his gout and that he is needing a stronger dosage. Pt states that he is still having pain with the dose that was prescribed for him. Medication: colchicine 0.6 MG tablet   Pt is also wanting to make sure his medication goes through [facility]) - [place], [place] - [address] [place] Phone: [phone] Fax: [phone]        LM on VM to call back

## 2023-01-22 NOTE — Telephone Encounter (Signed)
Message from [place] A sent at 01/22/2023 11:05 AM EDT  Summary: still having pain taking medicine   Pt states that his medication is not strong enough for his gout and that he is needing a stronger dosage. Pt states that he is still having pain with the dose that was prescribed for him. Medication: colchicine 0.6 MG tablet   Pt is also wanting to make sure his medication goes through [facility]) - [place], [place] - [address] [place] Phone: [phone] Fax: [phone] Called pt and Lm on VM to call back

## 2023-01-22 NOTE — Telephone Encounter (Addendum)
Message from Calvin A sent at 01/22/2023 11:05 AM EDT  Summary: still having pain taking medicine   Pt states that his medication is not strong enough for his gout and that he is needing a stronger dosage. Pt states that he is still having pain with the dose that was prescribed for him. Medication: colchicine 0.6 MG tablet   Pt is also wanting to make sure his medication goes through [facility]) - [place], [place] - [address] [place] Phone: [phone] Fax: [phone]         Called pt and LM on VM to call back to discuss sx. Third attempt. Routing to office.   . Reason for Disposition  Third attempt to contact caller AND no contact made. Phone number verified.  Protocols used: No Contact or Duplicate Contact Call-A-AH

## 2023-01-22 NOTE — Telephone Encounter (Signed)
  Chief Complaint: gout flare ups continue. Requesting more medication , higher dose or a different medication Symptoms: gout flare up continues with walking on foot. Swelling comes back and patient has changed diet and taking medication colchicine as directed. Last OV 01/18/23 Frequency: since 01/18/23 Pertinent Negatives: Patient denies na  Disposition: [] ED /[] Urgent Care (no appt availability in office) / [] Appointment(In office/virtual)/ []  [place] [facility]/ [] Home Care/ [] Refused Recommended Disposition /[] [place] [facility]/ [x]  Follow-up with PCP Additional Notes:   Requesting different medication or increase dose of colchicine. Please send Rx to [facility] mail delivery in [place]. [phone].        Reason for Disposition  Prescription request for new medicine (not a refill)  Answer Assessment - Initial Assessment Questions 1. NAME of MEDICINE: "What medicine(s) are you calling about?"     Colchicine 0.6 mg  2. QUESTION: "What is your question?" (e.g., double dose of medicine, side effect)     Can PCP prescribe more or another medication due to gout flare up continues? 3. PRESCRIBER: "Who prescribed the medicine?" Reason: if prescribed by specialist, call should be referred to that group.     [facility]  4. SYMPTOMS: "Do you have any symptoms?" If Yes, ask: "What symptoms are you having?"  "How bad are the symptoms (e.g., mild, moderate, severe)     Gout flare ups with walking  5. PREGNANCY:  "Is there any chance that you are pregnant?" "When was your last menstrual period?"     na  Protocols used: Medication Question Call-A-AH

## 2023-01-23 NOTE — Telephone Encounter (Signed)
Called patient to schedule an office visit  left message on machine foe patient to call and schedule and appointment.

## 2023-01-24 NOTE — Telephone Encounter (Signed)
Called patient to schedule an appoint he stated that it has went down and that he has an appointment to have teeth removed and will call  the office afterwards if he still needs to be seen.

## 2023-01-26 ENCOUNTER — Other Ambulatory Visit: Payer: Self-pay | Admitting: Neurology

## 2023-01-28 ENCOUNTER — Other Ambulatory Visit: Payer: Self-pay | Admitting: Nurse Practitioner

## 2023-01-30 NOTE — Telephone Encounter (Signed)
Requested Prescriptions  Pending Prescriptions Disp Refills   gabapentin (NEURONTIN) 400 MG capsule [Pharmacy Med Name: Gabapentin 400 MG Oral Capsule] 300 capsule 1    Sig: TAKE 1 CAPSULE BY MOUTH 3 TIMES  DAILY     Neurology: Anticonvulsants - gabapentin Passed - 01/28/2023 10:04 PM      Passed - Cr in normal range and within 360 days    Creatinine, Ser  Date Value Ref Range Status  12/04/2022 1.04 0.76 - 1.27 mg/dL Final         Passed - Completed PHQ-2 or PHQ-9 in the last 360 days      Passed - Valid encounter within last 12 months    Recent Outpatient Visits           1 week ago Acute idiopathic gout involving toe of right foot   [facility] Jackolyn Confer, MD   1 month ago Primary hypertension   [place] [facility] [place] [place], Sherran Needs, NP   1 year ago Annual physical exam   [place] [facility]ae Grooms, NP   1 year ago Left hemiparesis [facility])   [place] [facility] Larae Grooms, NP       Future Appointments             In 4 months Larae Grooms, NP [place] [facility], [facility]

## 2023-02-09 ENCOUNTER — Other Ambulatory Visit: Payer: Self-pay | Admitting: Pediatrics

## 2023-02-09 DIAGNOSIS — M10071 Idiopathic gout, right ankle and foot: Secondary | ICD-10-CM

## 2023-02-09 NOTE — Telephone Encounter (Signed)
Requested medication (s) are due for refill today: yes  Requested medication (s) are on the active medication list: yes  Last refill:  01/18/23 #15   Future visit scheduled:yes  Notes to clinic:  pharmacy requesting a 90 day fill. Was not sure if could do this considering the limited amount ordered    Requested Prescriptions  Pending Prescriptions Disp Refills   colchicine 0.6 MG tablet [Pharmacy Med Name: COLCHICINE 0.6 MG TABLET] 45 tablet 1    Sig: Take 2 tabs (1.2mg ) first, then another tab 1 hour later, continue to take 1-2 tabs daily until your symptoms resolve     Endocrinology:  Gout Agents - colchicine Failed - 02/09/2023  8:31 AM      Failed - CBC within normal limits and completed in the last 12 months    WBC  Date Value Ref Range Status  12/05/2022 5.4 3.4 - 10.8 x10E3/uL Final  09/09/2020 8.4 4.0 - 10.5 K/uL Final   RBC  Date Value Ref Range Status  12/05/2022 5.79 4.14 - 5.80 x10E6/uL Final  09/09/2020 5.43 4.22 - 5.81 MIL/uL Final   Hemoglobin  Date Value Ref Range Status  12/05/2022 13.0 13.0 - 17.7 g/dL Final   Hematocrit  Date Value Ref Range Status  12/05/2022 42.3 37.5 - 51.0 % Final   MCHC  Date Value Ref Range Status  12/05/2022 30.7 (L) 31.5 - 35.7 g/dL Final  11/91/4782 95.6 30.0 - 36.0 g/dL Final   [facility]  Date Value Ref Range Status  12/05/2022 22.5 (L) 26.6 - 33.0 pg Final  09/09/2020 22.3 (L) 26.0 - 34.0 pg Final   MCV  Date Value Ref Range Status  12/05/2022 73 (L) 79 - 97 fL Final   No results found for: "PLTCOUNTKUC", "LABPLAT", "POCPLA" RDW  Date Value Ref Range Status  12/05/2022 14.5 11.6 - 15.4 % Final         Passed - Cr in normal range and within 360 days    Creatinine, Ser  Date Value Ref Range Status  12/04/2022 1.04 0.76 - 1.27 mg/dL Final         Passed - ALT in normal range and within 360 days    ALT  Date Value Ref Range Status  12/04/2022 31 0 - 44 IU/L Final         Passed - AST in normal range and within  360 days    AST  Date Value Ref Range Status  12/04/2022 33 0 - 40 IU/L Final         Passed - Valid encounter within last 12 months    Recent Outpatient Visits           3 weeks ago Acute idiopathic gout involving toe of right foot   [place] [facility] Jackolyn Confer, MD   2 months ago Primary hypertension   [place] [facility] [place], Sherran Needs, NP   1 year ago Annual physical exam   [place] [facility] Larae Grooms, NP   1 year ago Left hemiparesis [facility])   [place] [facility] Larae Grooms, NP       Future Appointments             In 3 months Larae Grooms, NP [place] [facility], [facility]

## 2023-02-12 ENCOUNTER — Other Ambulatory Visit: Payer: Self-pay | Admitting: Neurology

## 2023-02-12 NOTE — Telephone Encounter (Signed)
Attempted to reach patient to inform him that he will need an appointment to get further assessed if he is still having issues.  The medication that was requested is not a chronic medication.  Put in CRM and will f/u again later.

## 2023-02-15 ENCOUNTER — Ambulatory Visit (INDEPENDENT_AMBULATORY_CARE_PROVIDER_SITE_OTHER): Payer: 59 | Admitting: Pediatrics

## 2023-02-15 ENCOUNTER — Encounter: Payer: Self-pay | Admitting: Pediatrics

## 2023-02-15 ENCOUNTER — Other Ambulatory Visit: Payer: Self-pay | Admitting: Pediatrics

## 2023-02-15 VITALS — BP 125/79 | HR 103 | Temp 98.4°F

## 2023-02-15 DIAGNOSIS — M10071 Idiopathic gout, right ankle and foot: Secondary | ICD-10-CM | POA: Diagnosis not present

## 2023-02-15 MED ORDER — PREDNISONE 20 MG PO TABS
40.0000 mg | ORAL_TABLET | Freq: Every day | ORAL | 0 refills | Status: AC
Start: 2023-02-15 — End: 2023-02-20

## 2023-02-15 MED ORDER — ALLOPURINOL 100 MG PO TABS
100.0000 mg | ORAL_TABLET | Freq: Every day | ORAL | 6 refills | Status: DC
Start: 2023-02-15 — End: 2023-05-31

## 2023-02-15 NOTE — Telephone Encounter (Signed)
Requested medication (s) are due for refill today: yes   Requested medication (s) are on the active medication list: yes   Last refill:  01/18/23 #15 0 refills  Future visit scheduled: yes in 3 months, and was seen today   Notes to clinic:  tapered drug. Last OV today . Do you want to refill Rx?     Requested Prescriptions  Pending Prescriptions Disp Refills   colchicine 0.6 MG tablet [Pharmacy Med Name: COLCHICINE 0.6 MG TABLET] 15 tablet 0    Sig: Take 2 tabs (1.2mg ) first, then another tab 1 hour later, continue to take 1-2 tabs daily until your symptoms resolve     Endocrinology:  Gout Agents - colchicine Failed - 02/15/2023  2:28 PM      Failed - CBC within normal limits and completed in the last 12 months    WBC  Date Value Ref Range Status  12/05/2022 5.4 3.4 - 10.8 x10E3/uL Final  09/09/2020 8.4 4.0 - 10.5 K/uL Final   RBC  Date Value Ref Range Status  12/05/2022 5.79 4.14 - 5.80 x10E6/uL Final  09/09/2020 5.43 4.22 - 5.81 MIL/uL Final   Hemoglobin  Date Value Ref Range Status  12/05/2022 13.0 13.0 - 17.7 g/dL Final   Hematocrit  Date Value Ref Range Status  12/05/2022 42.3 37.5 - 51.0 % Final   MCHC  Date Value Ref Range Status  12/05/2022 30.7 (L) 31.5 - 35.7 g/dL Final  16/02/9603 54.0 30.0 - 36.0 g/dL Final   [facility]  Date Value Ref Range Status  12/05/2022 22.5 (L) 26.6 - 33.0 pg Final  09/09/2020 22.3 (L) 26.0 - 34.0 pg Final   MCV  Date Value Ref Range Status  12/05/2022 73 (L) 79 - 97 fL Final   No results found for: "PLTCOUNTKUC", "LABPLAT", "POCPLA" RDW  Date Value Ref Range Status  12/05/2022 14.5 11.6 - 15.4 % Final         Passed - Cr in normal range and within 360 days    Creatinine, Ser  Date Value Ref Range Status  12/04/2022 1.04 0.76 - 1.27 mg/dL Final         Passed - ALT in normal range and within 360 days    ALT  Date Value Ref Range Status  12/04/2022 31 0 - 44 IU/L Final         Passed - AST in normal range and within  360 days    AST  Date Value Ref Range Status  12/04/2022 33 0 - 40 IU/L Final         Passed - Valid encounter within last 12 months    Recent Outpatient Visits           Today Acute idiopathic gout involving toe of right foot   [place] [facility] Jackolyn Confer, MD   4 weeks ago Acute idiopathic gout involving toe of right foot   [place] [facility] Jackolyn Confer, MD   2 months ago Primary hypertension   [place] [facility] [place], Sherran Needs, NP   1 year ago Annual physical exam   [place] [facility] Larae Grooms, NP   1 year ago Left hemiparesis [facility])   [place] [facility] Larae Grooms, NP       Future Appointments             In 3 months Larae Grooms, NP  [facility], [facility]

## 2023-02-15 NOTE — Progress Notes (Signed)
   Acute Visit  BP 125/79   Pulse (!) 103   Temp 98.4 F (36.9 C) (Oral)   SpO2 97%    Subjective:    Patient ID: Luis Clark, male    DOB: [birth year], 72 y.o.   MRN: [id number]  HPI: Luis Clark is a 72 y.o. male  Chief Complaint  Patient presents with   Gout   Gout Pain resolved after completing round of colchicine He notes pain has restarted Has not had seafood, some beer He is using crutches because of the pain  Relevant past medical, surgical, family and social history reviewed and updated as indicated. Interim medical history since our last visit reviewed. Allergies and medications reviewed and updated.  ROS per HPI unless specifically indicated above     Objective:    BP 125/79   Pulse (!) 103   Temp 98.4 F (36.9 C) (Oral)   SpO2 97%   Wt Readings from Last 3 Encounters:  01/18/23 265 lb 9.6 oz (120.5 kg)  12/04/22 266 lb 3.2 oz (120.7 kg)  10/30/22 259 lb (117.5 kg)     Physical Exam Constitutional:      Appearance: Normal appearance.  Pulmonary:     Effort: Pulmonary effort is normal.  Musculoskeletal:        General: Normal range of motion.     Right foot: Swelling and tenderness present.     Comments: Minimal swelling, but some compared to left foot. Tenderness at MTP joint and interphalangeal joints of toe and second toe.  Skin:    Comments: Normal skin color  Neurological:     General: No focal deficit present.     Mental Status: He is alert. Mental status is at baseline.  Psychiatric:        Mood and Affect: Mood normal.        Behavior: Behavior normal.        Thought Content: Thought content normal.        Assessment & Plan:  Assessment & Plan   Acute idiopathic gout involving toe of right foot Recurrent after colchicine therapy. Plan to get uric acid levels. Recommend prednisone course and to start daily allopurinol therapy. If persistent, recommend tapping joint to confirm diagnosis. No concern for septic joint based on  exam. Provided counseling on ongoing dietary modifications. Declines xray today but may consider if ongoing concerns. -     Uric acid -     CBC with Differential/Platelet -     predniSONE; Take 2 tablets (40 mg total) by mouth daily with breakfast for 5 days.  Dispense: 10 tablet; Refill: 0 -     Allopurinol; Take 1 tablet (100 mg total) by mouth daily.  Dispense: 30 tablet; Refill: 6  Follow up plan: Return if symptoms worsen or fail to improve.  Mckinlee Dunk Howell Pringle, MD

## 2023-02-15 NOTE — Patient Instructions (Addendum)
Prednisone 20mg  two pills for 5 days (total 40mg  daily)  You can take allopurinol daily from now on  You can start both today  I will call to check in tomorrow

## 2023-02-16 LAB — CBC WITH DIFFERENTIAL/PLATELET
Basophils Absolute: 0 10*3/uL (ref 0.0–0.2)
Basos: 0 %
EOS (ABSOLUTE): 0.4 10*3/uL (ref 0.0–0.4)
Eos: 6 %
Hematocrit: 41.6 % (ref 37.5–51.0)
Hemoglobin: 12.6 g/dL — ABNORMAL LOW (ref 13.0–17.7)
Immature Grans (Abs): 0 10*3/uL (ref 0.0–0.1)
Immature Granulocytes: 0 %
Lymphocytes Absolute: 1.7 10*3/uL (ref 0.7–3.1)
Lymphs: 30 %
MCH: 22.2 pg — ABNORMAL LOW (ref 26.6–33.0)
MCHC: 30.3 g/dL — ABNORMAL LOW (ref 31.5–35.7)
MCV: 73 fL — ABNORMAL LOW (ref 79–97)
Monocytes Absolute: 0.4 10*3/uL (ref 0.1–0.9)
Monocytes: 8 %
Neutrophils Absolute: 3.1 10*3/uL (ref 1.4–7.0)
Neutrophils: 56 %
Platelets: 210 10*3/uL (ref 150–450)
RBC: 5.67 x10E6/uL (ref 4.14–5.80)
RDW: 13.9 % (ref 11.6–15.4)
WBC: 5.5 10*3/uL (ref 3.4–10.8)

## 2023-02-16 LAB — URIC ACID: Uric Acid: 6.1 mg/dL (ref 3.8–8.4)

## 2023-03-01 ENCOUNTER — Ambulatory Visit (INDEPENDENT_AMBULATORY_CARE_PROVIDER_SITE_OTHER): Payer: 59 | Admitting: Pediatrics

## 2023-03-01 ENCOUNTER — Other Ambulatory Visit: Payer: Self-pay | Admitting: Pediatrics

## 2023-03-01 ENCOUNTER — Encounter: Payer: Self-pay | Admitting: Pediatrics

## 2023-03-01 VITALS — BP 136/71 | HR 87 | Temp 98.7°F | Ht 67.5 in | Wt 263.8 lb

## 2023-03-01 DIAGNOSIS — M792 Neuralgia and neuritis, unspecified: Secondary | ICD-10-CM

## 2023-03-01 DIAGNOSIS — N400 Enlarged prostate without lower urinary tract symptoms: Secondary | ICD-10-CM | POA: Insufficient documentation

## 2023-03-01 DIAGNOSIS — N529 Male erectile dysfunction, unspecified: Secondary | ICD-10-CM | POA: Insufficient documentation

## 2023-03-01 DIAGNOSIS — M1A9XX0 Chronic gout, unspecified, without tophus (tophi): Secondary | ICD-10-CM | POA: Diagnosis not present

## 2023-03-01 DIAGNOSIS — M10071 Idiopathic gout, right ankle and foot: Secondary | ICD-10-CM

## 2023-03-01 MED ORDER — VIAGRA 50 MG PO TABS: 50.0000 mg | ORAL_TABLET | Freq: Every day | ORAL | 12 refills | Status: DC | PRN

## 2023-03-01 MED ORDER — GABAPENTIN 600 MG PO TABS
600.0000 mg | ORAL_TABLET | Freq: Three times a day (TID) | ORAL | 11 refills | Status: DC
Start: 2023-03-01 — End: 2023-05-31

## 2023-03-01 NOTE — Patient Instructions (Signed)
Your current medications:  FOR BLOOD PRESSURE:   amLODipine (NORVASC) 10 MG tablet, Take 1 tablet (10 mg total) by mouth daily.  FOR CHOLESTEROL:   atorvastatin (LIPITOR) 20 MG tablet, Take 1 tablet (20 mg total) by mouth daily.  FOR ERECTILE DYSFUNCTION:   VIAGRA 50 MG tablet, Take 1 tablet (50 mg total) by mouth daily as needed for erectile dysfunction.  FOR PEEING ISSUES:   tamsulosin (FLOMAX) 0.4 MG CAPS capsule, Take 0.4 mg by mouth daily.  FOR GOUT:   allopurinol (ZYLOPRIM) 100 MG tablet, Take 1 tablet (100 mg total) by mouth daily.  FOR NEUROPATHY   DULoxetine (CYMBALTA) 60 MG capsule, TAKE 1 CAPSULE BY MOUTH DAILY   gabapentin (NEURONTIN) 600 MG capsule, TAKE 1 CAPSULE BY MOUTH 3 TIMES  DAILY  FOR YOU EYES:   LUMIGAN 0.01 % SOLN, Place 1 drop into both eyes at bedtime.   FOR ACID REFLUX:   omeprazole (PRILOSEC) 20 MG capsule, Take 20 mg by mouth daily.

## 2023-03-01 NOTE — Progress Notes (Signed)
Office Visit  BP 136/71   Pulse 87   Temp 98.7 F (37.1 C) (Oral)   Ht 5' 7.5" (1.715 m)   Wt 263 lb 12.8 oz (119.7 kg)   SpO2 93%   BMI 40.71 kg/m    Subjective:    Patient ID: Luis Clark, male    DOB: 12-26-1950, 72 y.o.   MRN: [id number]  HPI: ANGIE FAVRET is a 72 y.o. male  Chief Complaint  Patient presents with   Gout    Patient states his gout has gotten better since taking the Allopurinol. States he is not in as much pain as he was previously.    Discussed the use of AI scribe software for clinical note transcription with the patient, who gave verbal consent to proceed.  History of Present Illness   A 72 year old patient with a history of gout and neuropathy presented for follow-up after recent acute gout flares. The patient had completed a course of colchicine and prednisone and was started on allopurinol last visit. The patient reported improvement in pain, with the last episode occurring the previous week, prior to the completion of prednisone. The patient has been adhering to the daily allopurinol regimen, which is intended for long-term use to prevent gout flares.  The patient also reported neuropathic pain, which was attributed to a previous operation for spinal stenosis. The patient has been on gabapentin, which initially provided some relief.  The patient also reported issues with footwear causing discomfort, but denied any current pain in the foot. The patient has been on Viagra for approximately three years and reported it to be effective. Would like refills of the viagra.       Relevant past medical, surgical, family and social history reviewed and updated as indicated. Interim medical history since our last visit reviewed. Allergies and medications reviewed and updated.  ROS per HPI unless specifically indicated above     Objective:    BP 136/71   Pulse 87   Temp 98.7 F (37.1 C) (Oral)   Ht 5' 7.5" (1.715 m)   Wt 263 lb 12.8 oz (119.7 kg)    SpO2 93%   BMI 40.71 kg/m   Wt Readings from Last 3 Encounters:  03/01/23 263 lb 12.8 oz (119.7 kg)  01/18/23 265 lb 9.6 oz (120.5 kg)  12/04/22 266 lb 3.2 oz (120.7 kg)     Physical Exam Constitutional:      Appearance: Normal appearance.  Pulmonary:     Effort: Pulmonary effort is normal.  Musculoskeletal:        General: Normal range of motion.     Right foot: Normal. No tenderness.     Left foot: Normal. No tenderness.  Skin:    Comments: Normal skin color  Neurological:     General: No focal deficit present.     Mental Status: He is alert. Mental status is at baseline.  Psychiatric:        Mood and Affect: Mood normal.        Behavior: Behavior normal.        Thought Content: Thought content normal.         03/01/2023    9:36 AM 12/04/2022   10:00 AM 10/30/2022    1:06 PM 11/22/2021    8:26 AM 10/27/2021    9:20 AM  Depression screen PHQ 2/9  Decreased Interest 1 2 1 1  0  Down, Depressed, Hopeless 0 1 1 0 0  PHQ - 2 Score 1  3 2 1  0  Altered sleeping 0 1 0 2 2  Tired, decreased energy 0 2 1 1 2   Change in appetite 0 1 0 0 0  Feeling bad or failure about yourself  0 1 1 0 0  Trouble concentrating 0 1 0 0 0  Moving slowly or fidgety/restless 0 1 0 0 0  Suicidal thoughts 0 0 0 0 0  PHQ-9 Score 1 10 4 4 4   Difficult doing work/chores Not difficult at all Somewhat difficult Not difficult at all Not difficult at all        03/01/2023    9:37 AM 12/04/2022   10:00 AM 11/22/2021    8:26 AM  GAD 7 : Generalized Anxiety Score  Nervous, Anxious, on Edge 2 1 1   Control/stop worrying 0 0 0  Worry too much - different things 0 0 0  Trouble relaxing 0 1 1  Restless 1 0 0  Easily annoyed or irritable 1 0 1  Afraid - awful might happen 0 0 0  Total GAD 7 Score 4 2 3   Anxiety Difficulty Somewhat difficult Not difficult at all Not difficult at all       Assessment & Plan:  Assessment & Plan   Assessment and Plan    Gout   Improved since starting allopurinol.  Last flare was last Thursday, prior to starting prednisone. No current pain.   -Continue allopurinol daily for long-term management.   -Check uric acid level today. If above 6, will need to increase allopurinol dose.   -Consider x-ray if pain persists after changing shoes.    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 Unclear efficacy.   -Increase gabapentin dose to 600mg    -If no improvement, consider switching to Lyrica.    Erectile Dysfunction Needs refills for Viagra   -Send refills to patient's pharmacy.       Diagnosis and orders only below:  Chronic gout involving toe of right foot without tophus, unspecified cause -     Uric acid -     Comprehensive metabolic panel  Erectile dysfunction, unspecified erectile dysfunction type -     Viagra; Take 1 tablet (50 mg total) by mouth daily as needed for erectile dysfunction.  Dispense: 10 tablet; Refill: 12  Neuropathic pain -     Gabapentin; Take 1 tablet (600 mg total) by mouth 3 (three) times daily.  Dispense: 90 tablet; Refill: 11     Follow up plan: Return if symptoms worsen or fail to improve.  Dashonda Bonneau Howell Pringle, MD

## 2023-03-02 LAB — COMPREHENSIVE METABOLIC PANEL
ALT: 18 [IU]/L (ref 0–44)
AST: 20 [IU]/L (ref 0–40)
Albumin: 4.1 g/dL (ref 3.8–4.8)
Alkaline Phosphatase: 95 [IU]/L (ref 44–121)
BUN/Creatinine Ratio: 10 (ref 10–24)
BUN: 11 mg/dL (ref 8–27)
Bilirubin Total: 0.5 mg/dL (ref 0.0–1.2)
CO2: 21 mmol/L (ref 20–29)
Calcium: 9.3 mg/dL (ref 8.6–10.2)
Chloride: 105 mmol/L (ref 96–106)
Creatinine, Ser: 1.12 mg/dL (ref 0.76–1.27)
Globulin, Total: 3.1 g/dL (ref 1.5–4.5)
Glucose: 129 mg/dL — ABNORMAL HIGH (ref 70–99)
Potassium: 4.5 mmol/L (ref 3.5–5.2)
Sodium: 138 mmol/L (ref 134–144)
Total Protein: 7.2 g/dL (ref 6.0–8.5)
eGFR: 69 mL/min/{1.73_m2} (ref >59)

## 2023-03-02 LAB — URIC ACID: Uric Acid: 5.9 mg/dL (ref 3.8–8.4)

## 2023-03-02 NOTE — Telephone Encounter (Signed)
Unable to refill per protocol, Rx expired. Discontinued 03/01/23.  Requested Prescriptions  Pending Prescriptions Disp Refills   colchicine 0.6 MG tablet [Pharmacy Med Name: COLCHICINE 0.6 MG TABLET] 15 tablet 0    Sig: TAKE 2 TABS (1.2MG ) FIRST, THEN ANOTHER TAB 1 HOUR LATER, CONTINUE TO TAKE 1-2 TABS DAILY UNTIL YOUR SYMPTOMS RESOLVE     Endocrinology:  Gout Agents - colchicine Passed - 03/01/2023  2:13 PM      Passed - Cr in normal range and within 360 days    Creatinine, Ser  Date Value Ref Range Status  03/01/2023 1.12 0.76 - 1.27 mg/dL Final         Passed - ALT in normal range and within 360 days    ALT  Date Value Ref Range Status  03/01/2023 18 0 - 44 IU/L Final         Passed - AST in normal range and within 360 days    AST  Date Value Ref Range Status  03/01/2023 20 0 - 40 IU/L Final         Passed - Valid encounter within last 12 months    Recent Outpatient Visits           Yesterday Chronic gout involving toe of right foot without tophus, unspecified cause   [place] [facility] Jackolyn Confer, MD   2 weeks ago Acute idiopathic gout involving toe of right foot   [place] [facility] Jackolyn Confer, MD   1 month ago Acute idiopathic gout involving toe of right foot   [facility] Jackolyn Confer, MD   2 months ago Primary hypertension   [place] [facility] [place], Sherran Needs, NP   1 year ago Annual physical exam   [place] [facility] Larae Grooms, NP       Future Appointments             In 3 months Larae Grooms, NP [place] [facility]            Passed - CBC within normal limits and completed in the last 12 months    WBC  Date Value Ref Range Status  02/15/2023 5.5 3.4 - 10.8 x10E3/uL Final  09/09/2020 8.4 4.0 - 10.5 K/uL Final   RBC  Date Value Ref Range Status  02/15/2023 5.67 4.14 - 5.80 x10E6/uL Final   09/09/2020 5.43 4.22 - 5.81 MIL/uL Final   Hemoglobin  Date Value Ref Range Status  02/15/2023 12.6 (L) 13.0 - 17.7 g/dL Final   Hematocrit  Date Value Ref Range Status  02/15/2023 41.6 37.5 - 51.0 % Final   MCHC  Date Value Ref Range Status  02/15/2023 30.3 (L) 31.5 - 35.7 g/dL Final  96/29/5284 13.2 30.0 - 36.0 g/dL Final   [facility]  Date Value Ref Range Status  02/15/2023 22.2 (L) 26.6 - 33.0 pg Final  09/09/2020 22.3 (L) 26.0 - 34.0 pg Final   MCV  Date Value Ref Range Status  02/15/2023 73 (L) 79 - 97 fL Final   No results found for: "PLTCOUNTKUC", "LABPLAT", "POCPLA" RDW  Date Value Ref Range Status  02/15/2023 13.9 11.6 - 15.4 % Final

## 2023-03-23 ENCOUNTER — Telehealth: Payer: Self-pay | Admitting: Nurse Practitioner

## 2023-03-23 NOTE — Telephone Encounter (Signed)
Ok to start form for the patient?

## 2023-03-23 NOTE — Telephone Encounter (Unsigned)
Copied from CRM [id number]. Topic: General - Inquiry >> Mar 23, 2023  1:11 PM Lennox Pippins wrote: Patient is requesting a new handicap placard form to be filled out.    Please advise and follow back up with the patient @ #[phone]

## 2023-03-26 NOTE — Telephone Encounter (Signed)
Yes okay to fill out form.

## 2023-03-26 NOTE — Telephone Encounter (Signed)
Form started. Placed in providers folder for completion and signature.

## 2023-03-27 NOTE — Telephone Encounter (Signed)
Form completed and signed by provider. Called and notified patient that the form was ready to be picked up.

## 2023-04-09 ENCOUNTER — Other Ambulatory Visit: Payer: Self-pay | Admitting: Nurse Practitioner

## 2023-04-12 NOTE — Telephone Encounter (Signed)
Requested Prescriptions  Pending Prescriptions Disp Refills   DULoxetine (CYMBALTA) 60 MG capsule [Pharmacy Med Name: DULoxetine HCl 60 MG Oral Capsule Delayed Release Particles] 90 capsule 3    Sig: TAKE 1 CAPSULE BY MOUTH DAILY     Psychiatry: Antidepressants - SNRI - duloxetine Passed - 04/12/2023 12:22 PM      Passed - Cr in normal range and within 360 days    Creatinine, Ser  Date Value Ref Range Status  03/01/2023 1.12 0.76 - 1.27 mg/dL Final         Passed - eGFR is 30 or above and within 360 days    GFR, Estimated  Date Value Ref Range Status  09/09/2020 >60 >60 mL/min Final    Comment:    (NOTE) Calculated using the CKD-EPI Creatinine Equation (2021) Performed at [facility], [address], [place], [place] [address]    eGFR  Date Value Ref Range Status  03/01/2023 69 >59 mL/min/1.73 Final         Passed - Completed PHQ-2 or PHQ-9 in the last 360 days      Passed - Last BP in normal range    BP Readings from Last 1 Encounters:  03/01/23 136/71         Passed - Valid encounter within last 6 months    Recent Outpatient Visits           1 month ago Chronic gout involving toe of right foot without tophus, unspecified cause   [place] [facility] Jackolyn Confer, MD   1 month ago Acute idiopathic gout involving toe of right foot   [place] [facility] Jackolyn Confer, MD   2 months ago Acute idiopathic gout involving toe of right foot   [place] [facility] Jackolyn Confer, MD   4 months ago Primary hypertension   [place] [facility] [place], Sherran Needs, NP   1 year ago Annual physical exam   [place] [facility] Larae Grooms, NP       Future Appointments             In 1 month Larae Grooms, NP [place] [facility]

## 2023-04-27 ENCOUNTER — Other Ambulatory Visit: Payer: Self-pay | Admitting: Pediatrics

## 2023-04-27 DIAGNOSIS — M10071 Idiopathic gout, right ankle and foot: Secondary | ICD-10-CM

## 2023-04-27 NOTE — Telephone Encounter (Signed)
Medication discontinued due to change in therapy on 03/01/23. Requested Prescriptions  Pending Prescriptions Disp Refills   colchicine 0.6 MG tablet [Pharmacy Med Name: COLCHICINE 0.6 MG TABLET] 15 tablet 0    Sig: TAKE 2 TABS (1.2MG ) FIRST, THEN ANOTHER TAB 1 HOUR LATER, CONTINUE TO TAKE 1-2 TABS DAILY UNTIL YOUR SYMPTOMS RESOLVE     Endocrinology:  Gout Agents - colchicine Passed - 04/27/2023  4:55 PM      Passed - Cr in normal range and within 360 days    Creatinine, Ser  Date Value Ref Range Status  03/01/2023 1.12 0.76 - 1.27 mg/dL Final         Passed - ALT in normal range and within 360 days    ALT  Date Value Ref Range Status  03/01/2023 18 0 - 44 IU/L Final         Passed - AST in normal range and within 360 days    AST  Date Value Ref Range Status  03/01/2023 20 0 - 40 IU/L Final         Passed - Valid encounter within last 12 months    Recent Outpatient Visits           1 month ago Chronic gout involving toe of right foot without tophus, unspecified cause   [place] [facility] Jackolyn Confer, MD   2 months ago Acute idiopathic gout involving toe of right foot   [place] [facility] Jackolyn Confer, MD   3 months ago Acute idiopathic gout involving toe of right foot   [place] [facility] Jackolyn Confer, MD   4 months ago Primary hypertension   [place] [facility] [place], Sherran Needs, NP   1 year ago Annual physical exam   [place] [facility] Larae Grooms, NP       Future Appointments             In 1 month Larae Grooms, NP [place] [facility]            Passed - CBC within normal limits and completed in the last 12 months    WBC  Date Value Ref Range Status  02/15/2023 5.5 3.4 - 10.8 x10E3/uL Final  09/09/2020 8.4 4.0 - 10.5 K/uL Final   RBC  Date Value Ref Range Status  02/15/2023 5.67 4.14 - 5.80 x10E6/uL Final  09/09/2020  5.43 4.22 - 5.81 MIL/uL Final   Hemoglobin  Date Value Ref Range Status  02/15/2023 12.6 (L) 13.0 - 17.7 g/dL Final   Hematocrit  Date Value Ref Range Status  02/15/2023 41.6 37.5 - 51.0 % Final   MCHC  Date Value Ref Range Status  02/15/2023 30.3 (L) 31.5 - 35.7 g/dL Final  96/08/5407 81.1 30.0 - 36.0 g/dL Final   [facility]  Date Value Ref Range Status  02/15/2023 22.2 (L) 26.6 - 33.0 pg Final  09/09/2020 22.3 (L) 26.0 - 34.0 pg Final   MCV  Date Value Ref Range Status  02/15/2023 73 (L) 79 - 97 fL Final   No results found for: "PLTCOUNTKUC", "LABPLAT", "POCPLA" RDW  Date Value Ref Range Status  02/15/2023 13.9 11.6 - 15.4 % Final

## 2023-05-02 ENCOUNTER — Other Ambulatory Visit: Payer: Self-pay | Admitting: Family Medicine

## 2023-05-04 NOTE — Telephone Encounter (Signed)
Requested by interface surescripts. Future visit in 1 month.  Requested Prescriptions  Pending Prescriptions Disp Refills   atorvastatin (LIPITOR) 20 MG tablet [Pharmacy Med Name: Atorvastatin Calcium 20 MG Oral Tablet] 100 tablet 2    Sig: TAKE 1 TABLET BY MOUTH DAILY     Cardiovascular:  Antilipid - Statins Failed - 05/04/2023  9:28 AM      Failed - Lipid Panel in normal range within the last 12 months    Cholesterol, Total  Date Value Ref Range Status  12/04/2022 157 100 - 199 mg/dL Final   LDL Chol Calc (NIH)  Date Value Ref Range Status  12/04/2022 92 0 - 99 mg/dL Final   HDL  Date Value Ref Range Status  12/04/2022 36 (L) >39 mg/dL Final   Triglycerides  Date Value Ref Range Status  12/04/2022 169 (H) 0 - 149 mg/dL Final         Passed - Patient is not pregnant      Passed - Valid encounter within last 12 months    Recent Outpatient Visits           2 months ago Chronic gout involving toe of right foot without tophus, unspecified cause   [place] [facility] Jackolyn Confer, MD   2 months ago Acute idiopathic gout involving toe of right foot   [facility] Jack[facility], [facility]   3 months ago Acute idiopathic gout involving toe of right foot   [place] Jack[facility]yn Con[facility], MD   5 months ago Primary hypertension   [place] [facility] [place], Sherran Needs, NP   1 year ago Annual physical exam   [place] [facility] Larae Grooms, NP       Future Appointments             In 1 month Larae Grooms, NP [place] [facility]

## 2023-05-31 ENCOUNTER — Other Ambulatory Visit: Payer: Self-pay

## 2023-05-31 DIAGNOSIS — M10071 Idiopathic gout, right ankle and foot: Secondary | ICD-10-CM

## 2023-05-31 DIAGNOSIS — M792 Neuralgia and neuritis, unspecified: Secondary | ICD-10-CM

## 2023-05-31 DIAGNOSIS — N529 Male erectile dysfunction, unspecified: Secondary | ICD-10-CM

## 2023-05-31 MED ORDER — TAMSULOSIN HCL 0.4 MG PO CAPS: 0.4000 mg | ORAL_CAPSULE | Freq: Every day | ORAL | 1 refills | Status: DC

## 2023-05-31 MED ORDER — VIAGRA 50 MG PO TABS
50.0000 mg | ORAL_TABLET | Freq: Every day | ORAL | 12 refills | Status: DC | PRN
Start: 2023-05-31 — End: 2023-11-28

## 2023-05-31 MED ORDER — OMEPRAZOLE 20 MG PO CPDR: 20.0000 mg | DELAYED_RELEASE_CAPSULE | Freq: Every day | ORAL | 1 refills | Status: DC

## 2023-05-31 MED ORDER — DULOXETINE HCL 60 MG PO CPEP: 60.0000 mg | ORAL_CAPSULE | Freq: Every day | ORAL | 1 refills | Status: DC

## 2023-05-31 MED ORDER — ALLOPURINOL 100 MG PO TABS: 100.0000 mg | ORAL_TABLET | Freq: Every day | ORAL | 1 refills | Status: DC

## 2023-05-31 MED ORDER — GABAPENTIN 600 MG PO TABS: 600.0000 mg | ORAL_TABLET | Freq: Three times a day (TID) | ORAL | 1 refills | Status: DC

## 2023-05-31 NOTE — Telephone Encounter (Signed)
Patient now using mail order pharmacy 

## 2023-06-06 ENCOUNTER — Ambulatory Visit: Payer: Medicare PPO | Admitting: Nurse Practitioner

## 2023-06-06 NOTE — Progress Notes (Deleted)
   There were no vitals taken for this visit.   Subjective:    Patient ID: Luis Clark, male    DOB: [birth year], 73 y.o.   MRN: [id number]  HPI: Luis Clark is a 73 y.o. male  No chief complaint on file.  HYPERTENSION / HYPERLIPIDEMIA Satisfied with current treatment? yes Duration of hypertension: years BP monitoring frequency: not checking BP range:  BP medication side effects: no Past BP meds: amlodipine Duration of hyperlipidemia: years Cholesterol medication side effects: no Cholesterol supplements: none Past cholesterol medications: atorvastain (lipitor) Medication compliance: excellent compliance Aspirin: no Recent stressors: no Recurrent headaches: no Visual changes: no Palpitations: no Dyspnea: no Chest pain: no Lower extremity edema: no Dizzy/lightheaded: no   NEUROPATHY Patient feels like his neuropathy has improved since increasing the dose to 400mg  TID.  He would like to continue with that dose at this time.    Relevant past medical, surgical, family and social history reviewed and updated as indicated. Interim medical history since our last visit reviewed. Allergies and medications reviewed and updated.  Review of Systems  Per HPI unless specifically indicated above     Objective:    There were no vitals taken for this visit.  Wt Readings from Last 3 Encounters:  03/01/23 263 lb 12.8 oz (119.7 kg)  01/18/23 265 lb 9.6 oz (120.5 kg)  12/04/22 266 lb 3.2 oz (120.7 kg)    Physical Exam  Results for orders placed or performed in visit on 03/01/23  Uric acid   Collection Time: 03/01/23  9:55 AM  Result Value Ref Range   Uric Acid 5.9 3.8 - 8.4 mg/dL  Comp Met (CMET)   Collection Time: 03/01/23  9:55 AM  Result Value Ref Range   Glucose 129 (H) 70 - 99 mg/dL   BUN 11 8 - 27 mg/dL   Creatinine, Ser 8.11 0.76 - 1.27 mg/dL   eGFR 69 >91 YN/WGN/5.62   BUN/Creatinine Ratio 10 10 - 24   Sodium 138 134 - 144 mmol/L   Potassium 4.5 3.5 - 5.2  mmol/L   Chloride 105 96 - 106 mmol/L   CO2 21 20 - 29 mmol/L   Calcium 9.3 8.6 - 10.2 mg/dL   Total Protein 7.2 6.0 - 8.5 g/dL   Albumin 4.1 3.8 - 4.8 g/dL   Globulin, Total 3.1 1.5 - 4.5 g/dL   Bilirubin Total 0.5 0.0 - 1.2 mg/dL   Alkaline Phosphatase 95 44 - 121 IU/L   AST 20 0 - 40 IU/L   ALT 18 0 - 44 IU/L      Assessment & Plan:   Problem List Items Addressed This Visit       Cardiovascular and Mediastinum   Hypertension     Nervous and Auditory   Left hemiparesis (HCC) - Primary     Other   HLD (hyperlipidemia)     Follow up plan: No follow-ups on file.

## 2023-07-17 ENCOUNTER — Other Ambulatory Visit: Payer: Self-pay | Admitting: Pediatrics

## 2023-07-17 DIAGNOSIS — M10071 Idiopathic gout, right ankle and foot: Secondary | ICD-10-CM

## 2023-07-18 NOTE — Telephone Encounter (Signed)
 allopurinol (ZYLOPRIM) 100 MG tablet 90 tablet 1 05/31/2023 --   Sig - Route: Take 1 tablet (100 mg total) by mouth daily. - Oral   Sent to pharmacy as: allopurinol (ZYLOPRIM) 100 MG tablet   E-Prescribing Status: Receipt confirmed by pharmacy (05/31/2023 11:09 AM EST)   Rx was sent to [facility]  Requested Prescriptions  Pending Prescriptions Disp Refills   allopurinol (ZYLOPRIM) 100 MG tablet [Pharmacy Med Name: ALLOPURINOL 100 MG TABLET] 90 tablet 2    Sig: TAKE 1 TABLET BY MOUTH EVERY DAY     Endocrinology:  Gout Agents - allopurinol Passed - 07/18/2023 11:44 AM      Passed - Uric Acid in normal range and within 360 days    Uric Acid  Date Value Ref Range Status  03/01/2023 5.9 3.8 - 8.4 mg/dL Final    Comment:               Therapeutic target for gout patients: <6.0         Passed - Cr in normal range and within 360 days    Creatinine, Ser  Date Value Ref Range Status  03/01/2023 1.12 0.76 - 1.27 mg/dL Final         Passed - Valid encounter within last 12 months    Recent Outpatient Visits           4 months ago Chronic gout involving toe of right foot without tophus, unspecified cause   [place] [facility] Jackolyn Confer, MD   5 months ago Acute idiopathic gout involving toe of right foot   [place] [facility] Jackolyn Confer, MD   6 months ago Acute idiopathic gout involving toe of right foot   [place] [facility] Jackolyn Confer, MD   7 months ago Primary hypertension   [place] [facility] [place], Sherran Needs, NP   1 year ago Annual physical exam    [facility] Larae Grooms, NP              Passed - CBC within normal limits and completed in the last 12 months    WBC  Date Value Ref Range Status  02/15/2023 5.5 3.4 - 10.8 x10E3/uL Final  09/09/2020 8.4 4.0 - 10.5 K/uL Final   RBC  Date Value Ref Range Status  02/15/2023 5.67 4.14 - 5.80 x10E6/uL  Final  09/09/2020 5.43 4.22 - 5.81 MIL/uL Final   Hemoglobin  Date Value Ref Range Status  02/15/2023 12.6 (L) 13.0 - 17.7 g/dL Final   Hematocrit  Date Value Ref Range Status  02/15/2023 41.6 37.5 - 51.0 % Final   MCHC  Date Value Ref Range Status  02/15/2023 30.3 (L) 31.5 - 35.7 g/dL Final  16/02/9603 54.0 30.0 - 36.0 g/dL Final   [facility]  Date Value Ref Range Status  02/15/2023 22.2 (L) 26.6 - 33.0 pg Final  09/09/2020 22.3 (L) 26.0 - 34.0 pg Final   MCV  Date Value Ref Range Status  02/15/2023 73 (L) 79 - 97 fL Final   No results found for: "PLTCOUNTKUC", "LABPLAT", "POCPLA" RDW  Date Value Ref Range Status  02/15/2023 13.9 11.6 - 15.4 % Final

## 2023-08-23 ENCOUNTER — Other Ambulatory Visit: Payer: Self-pay | Admitting: Pediatrics

## 2023-08-23 DIAGNOSIS — M10071 Idiopathic gout, right ankle and foot: Secondary | ICD-10-CM

## 2023-08-24 NOTE — Telephone Encounter (Signed)
 Requested Prescriptions  Refused Prescriptions Disp Refills   allopurinol  (ZYLOPRIM ) 100 MG tablet [Pharmacy Med Name: ALLOPURINOL  100 MG TABLET] 90 tablet 2    Sig: TAKE 1 TABLET BY MOUTH EVERY DAY     Endocrinology:  Gout Agents - allopurinol  Failed - 08/24/2023  8:00 AM      Failed - Valid encounter within last 12 months    Recent Outpatient Visits   None            Passed - Uric Acid in normal range and within 360 days    Uric Acid  Date Value Ref Range Status  03/01/2023 5.9 3.8 - 8.4 mg/dL Final    Comment:               Therapeutic target for gout patients: <6.0         Passed - Cr in normal range and within 360 days    Creatinine, Ser  Date Value Ref Range Status  03/01/2023 1.12 0.76 - 1.27 mg/dL Final         Passed - CBC within normal limits and completed in the last 12 months    WBC  Date Value Ref Range Status  02/15/2023 5.5 3.4 - 10.8 x10E3/uL Final  09/09/2020 8.4 4.0 - 10.5 K/uL Final   RBC  Date Value Ref Range Status  02/15/2023 5.67 4.14 - 5.80 x10E6/uL Final  09/09/2020 5.43 4.22 - 5.81 MIL/uL Final   Hemoglobin  Date Value Ref Range Status  02/15/2023 12.6 (L) 13.0 - 17.7 g/dL Final   Hematocrit  Date Value Ref Range Status  02/15/2023 41.6 37.5 - 51.0 % Final   MCHC  Date Value Ref Range Status  02/15/2023 30.3 (L) 31.5 - 35.7 g/dL Final  16/02/9603 54.0 30.0 - 36.0 g/dL Final   [facility]  Date Value Ref Range Status  02/15/2023 22.2 (L) 26.6 - 33.0 pg Final  09/09/2020 22.3 (L) 26.0 - 34.0 pg Final   MCV  Date Value Ref Range Status  02/15/2023 73 (L) 79 - 97 fL Final   No results found for: "PLTCOUNTKUC", "LABPLAT", "POCPLA" RDW  Date Value Ref Range Status  02/15/2023 13.9 11.6 - 15.4 % Final

## 2023-08-30 ENCOUNTER — Telehealth: Payer: Self-pay

## 2023-08-30 DIAGNOSIS — M792 Neuralgia and neuritis, unspecified: Secondary | ICD-10-CM

## 2023-08-30 NOTE — Telephone Encounter (Signed)
 Copied from CRM [id number]. Topic: Referral - Request for Referral >> Aug 30, 2023  9:54 AM Dimple Francis wrote: Reason for CRM: Patient looking to get a referral to a Neurologist. Please contact as soon as possible [phone]

## 2023-08-31 ENCOUNTER — Other Ambulatory Visit: Payer: Self-pay | Admitting: Pediatrics

## 2023-08-31 DIAGNOSIS — M10071 Idiopathic gout, right ankle and foot: Secondary | ICD-10-CM

## 2023-08-31 NOTE — Telephone Encounter (Signed)
 Requested Prescriptions  Pending Prescriptions Disp Refills   allopurinol  (ZYLOPRIM ) 100 MG tablet [Pharmacy Med Name: ALLOPURINOL  100 MG TABLET] 90 tablet 2    Sig: TAKE 1 TABLET BY MOUTH EVERY DAY     Endocrinology:  Gout Agents - allopurinol  Failed - 08/31/2023  3:40 PM      Failed - Valid encounter within last 12 months    Recent Outpatient Visits   None            Passed - Uric Acid in normal range and within 360 days    Uric Acid  Date Value Ref Range Status  03/01/2023 5.9 3.8 - 8.4 mg/dL Final    Comment:               Therapeutic target for gout patients: <6.0         Passed - Cr in normal range and within 360 days    Creatinine, Ser  Date Value Ref Range Status  03/01/2023 1.12 0.76 - 1.27 mg/dL Final         Passed - CBC within normal limits and completed in the last 12 months    WBC  Date Value Ref Range Status  02/15/2023 5.5 3.4 - 10.8 x10E3/uL Final  09/09/2020 8.4 4.0 - 10.5 K/uL Final   RBC  Date Value Ref Range Status  02/15/2023 5.67 4.14 - 5.80 x10E6/uL Final  09/09/2020 5.43 4.22 - 5.81 MIL/uL Final   Hemoglobin  Date Value Ref Range Status  02/15/2023 12.6 (L) 13.0 - 17.7 g/dL Final   Hematocrit  Date Value Ref Range Status  02/15/2023 41.6 37.5 - 51.0 % Final   MCHC  Date Value Ref Range Status  02/15/2023 30.3 (L) 31.5 - 35.7 g/dL Final  16/02/9603 54.0 30.0 - 36.0 g/dL Final   [facility]  Date Value Ref Range Status  02/15/2023 22.2 (L) 26.6 - 33.0 pg Final  09/09/2020 22.3 (L) 26.0 - 34.0 pg Final   MCV  Date Value Ref Range Status  02/15/2023 73 (L) 79 - 97 fL Final   No results found for: "PLTCOUNTKUC", "LABPLAT", "POCPLA" RDW  Date Value Ref Range Status  02/15/2023 13.9 11.6 - 15.4 % Final

## 2023-09-05 NOTE — Telephone Encounter (Signed)
 Referral placed for patient.

## 2023-09-05 NOTE — Telephone Encounter (Signed)
 Patient aware that referral has been placed.

## 2023-09-05 NOTE — Telephone Encounter (Signed)
 Called and spoke with the patient. He states he would like see neurology for neuropathy.

## 2023-10-16 ENCOUNTER — Ambulatory Visit: Admitting: Nurse Practitioner

## 2023-10-31 ENCOUNTER — Ambulatory Visit: Admitting: Nurse Practitioner

## 2023-10-31 NOTE — Progress Notes (Deleted)
 There were no vitals taken for this visit.   Subjective:    Patient ID: Luis Clark, male    DOB: 1950/07/27, 73 y.o.   MRN: [id number]  HPI: Luis Clark is a 73 y.o. male presenting on 10/31/2023 for comprehensive medical examination. Current medical complaints include:neuropathy  He currently lives with: Interim Problems from his last visit: no  HYPERTENSION / HYPERLIPIDEMIA Satisfied with current treatment? yes Duration of hypertension: years BP monitoring frequency: not checking BP range:  BP medication side effects: no Past BP meds: amlodipine  Duration of hyperlipidemia: years Cholesterol medication side effects: no Cholesterol supplements: none Past cholesterol medications: atorvastain (lipitor) Medication compliance: excellent compliance Aspirin: no Recent stressors: no Recurrent headaches: no Visual changes: no Palpitations: no Dyspnea: no Chest pain: no Lower extremity edema: no Dizzy/lightheaded: no   NEUROPATHY Patient feels like his neuropathy has improved since increasing the dose to 400mg  TID.  He would like to continue with that dose at this time.     Depression Screen done today and results listed below:     03/01/2023    9:36 AM 12/04/2022   10:00 AM 10/30/2022    1:06 PM 11/22/2021    8:26 AM 10/27/2021    9:20 AM  Depression screen PHQ 2/9  Decreased Interest 1 2 1 1  0  Down, Depressed, Hopeless 0 1 1 0 0  PHQ - 2 Score 1 3 2 1  0  Altered sleeping 0 1 0 2 2  Tired, decreased energy 0 2 1 1 2   Change in appetite 0 1 0 0 0  Feeling bad or failure about yourself  0 1 1 0 0  Trouble concentrating 0 1 0 0 0  Moving slowly or fidgety/restless 0 1 0 0 0  Suicidal thoughts 0 0 0 0 0  PHQ-9 Score 1 10 4 4 4   Difficult doing work/chores Not difficult at all Somewhat difficult Not difficult at all Not difficult at all     The patient does not have a history of falls. I did complete a risk assessment for falls. A plan of care for falls was  documented.   Past Medical History:  Past Medical History:  Diagnosis Date  . Cataract   . Enlarged prostate   . GERD (gastroesophageal reflux disease)   . HLD (hyperlipidemia)   . Hypertension   . Neuropathic pain   . Thoracic stenosis     Surgical History:  Past Surgical History:  Procedure Laterality Date  . BACK SURGERY    . COLONOSCOPY WITH PROPOFOL  N/A 11/16/2021   Procedure: COLONOSCOPY WITH PROPOFOL ;  Surgeon: Therisa Bi, MD;  Location: [facility] ENDOSCOPY;  Service: Gastroenterology;  Laterality: N/A;  . KNEE SURGERY    . LUMBAR LAMINECTOMY/DECOMPRESSION MICRODISCECTOMY N/A 09/09/2020   Procedure: Thoracic eleven-twelve Laminectomy;  Surgeon: Gillie Duncans, MD;  Location[facility];  Service: Neurosurgery;  Laterality: N/A;    Medications:  Current Outpatient Medications on File Prior to Visit  Medication Sig  . allopurinol  (ZYLOPRIM ) 100 MG tablet Take 1 tablet (100 mg total) by mouth daily.  . amLODipine  (NORVASC ) 10 MG tablet Take 1 tablet (10 mg total) by mouth daily.  . atorvastatin  (LIPITOR) 20 MG tablet TAKE 1 TABLET BY MOUTH DAILY  . DULoxetine  (CYMBALTA ) 60 MG capsule Take 1 capsule (60 mg total) by mouth daily.  . gabapentin  (NEURONTIN ) 600 MG tablet Take 1 tablet (600 mg total) by mouth 3 (three) times daily.  SABRA LUMIGAN 0.01 % SOLN Place 1 drop  into both eyes at bedtime.   . Misc Natural Products (BEET ROOT) 500 MG CAPS Take 1 capsule by mouth in the morning and at bedtime.  . Multiple Vitamin (MULTIVITAMIN) tablet Take 1 tablet by mouth daily.  . omeprazole  (PRILOSEC) 20 MG capsule Take 1 capsule (20 mg total) by mouth daily.  . tamsulosin  (FLOMAX ) 0.4 MG CAPS capsule Take 1 capsule (0.4 mg total) by mouth daily.  . tiZANidine  (ZANAFLEX ) 4 MG tablet Take 1 tablet (4 mg total) by mouth every 6 (six) hours as needed for muscle spasms.  . VIAGRA  50 MG tablet Take 1 tablet (50 mg total) by mouth daily as needed for erectile dysfunction.   No current  facility-administered medications on file prior to visit.    Allergies:  Allergies  Allergen Reactions  . Penicillin G Swelling  . Penicillins Swelling    Inflammation  73 years old    Social History:  Social History   Socioeconomic History  . Marital status: Legally Separated    Spouse name: Not on file  . Number of children: Not on file  . Years of education: Not on file  . Highest education level: Not on file  Occupational History  . Not on file  Tobacco Use  . Smoking status: Former    Current packs/day: 0.00    Types: Cigarettes    Start date: 43    Quit date: 2014    Years since quitting: 11.4  . Smokeless tobacco: Never  Vaping Use  . Vaping status: Never Used  Substance and Sexual Activity  . Alcohol use: Not Currently  . Drug use: Not Currently  . Sexual activity: Yes  Other Topics Concern  . Not on file  Social History Narrative  . Not on file   Social Drivers of Health   Financial Resource Strain: Low Risk  (10/30/2022)   Overall Financial Resource Strain (CARDIA)   . Difficulty of Paying Living Expenses: Not hard at all  Food Insecurity: No Food Insecurity (10/30/2022)   Hunger Vital Sign   . Worried About Programme researcher, broadcasting/film/video in the Last Year: Never true   . Ran Out of Food in the Last Year: Never true  Transportation Needs: No Transportation Needs (10/30/2022)   PRAPARE - Transportation   . Lack of Transportation (Medical): No   . Lack of Transportation (Non-Medical): No  Physical Activity: Insufficiently Active (10/30/2022)   Exercise Vital Sign   . Days of Exercise per Week: 4 days   . Minutes of Exercise per Session: 30 min  Stress: No Stress Concern Present (10/30/2022)   [employer] of Occupational Health - Occupational Stress Questionnaire   . Feeling of Stress : Only a little  Social Connections: Moderately Isolated (10/30/2022)   Social Connection and Isolation Panel   . Frequency of Communication with Friends and Family: Once a  week   . Frequency of Social Gatherings with Friends and Family: Once a week   . Attends Religious Services: More than 4 times per year   . Active Member of Clubs or Organizations: Yes   . Attends Banker Meetings: Never   . Marital Status: Widowed  Intimate Partner Violence: Not At Risk (10/30/2022)   Humiliation, Afraid, Rape, and Kick questionnaire   . Fear of Current or Ex-Partner: No   . Emotionally Abused: No   . Physically Abused: No   . Sexually Abused: No   Social History   Tobacco Use  Smoking Status Former  .  Current packs/day: 0.00  . Types: Cigarettes  . Start date: 64  . Quit date: 2014  . Years since quitting: 11.4  Smokeless Tobacco Never   Social History   Substance and Sexual Activity  Alcohol Use Not Currently    Family History:  No family history on file.  Past medical history, surgical history, medications, allergies, family history and social history reviewed with patient today and changes made to appropriate areas of the chart.   Review of Systems  Eyes:  Negative for blurred vision and double vision.  Respiratory:  Negative for shortness of breath.   Cardiovascular:  Negative for chest pain, palpitations and leg swelling.  Neurological:  Positive for tingling. Negative for dizziness and headaches.   All other ROS negative except what is listed above and in the HPI.      Objective:    There were no vitals taken for this visit.  Wt Readings from Last 3 Encounters:  03/01/23 263 lb 12.8 oz (119.7 kg)  01/18/23 265 lb 9.6 oz (120.5 kg)  12/04/22 266 lb 3.2 oz (120.7 kg)    Physical Exam Vitals and nursing note reviewed.  Constitutional:      General: He is not in acute distress.    Appearance: Normal appearance. He is obese. He is not ill-appearing, toxic-appearing or diaphoretic.  HENT:     Head: Normocephalic.     Right Ear: Tympanic membrane, ear canal and external ear normal.     Left Ear: Tympanic membrane, ear canal  and external ear normal.     Nose: Nose normal. No congestion or rhinorrhea.     Mouth/Throat:     Mouth: Mucous membranes are moist.   Eyes:     General:        Right eye: No discharge.        Left eye: No discharge.     Extraocular Movements: Extraocular movements intact.     Conjunctiva/sclera: Conjunctivae normal.     Pupils: Pupils are equal, round, and reactive to light.    Cardiovascular:     Rate and Rhythm: Normal rate and regular rhythm.     Heart sounds: No murmur heard. Pulmonary:     Effort: Pulmonary effort is normal. No respiratory distress.     Breath sounds: Normal breath sounds. No wheezing, rhonchi or rales.  Abdominal:     General: Abdomen is flat. Bowel sounds are normal. There is no distension.     Palpations: Abdomen is soft.     Tenderness: There is no abdominal tenderness. There is no guarding.   Musculoskeletal:     Cervical back: Normal range of motion and neck supple.     Comments: Uses cane. Left sided weakness.   Skin:    General: Skin is warm and dry.     Capillary Refill: Capillary refill takes less than 2 seconds.   Neurological:     General: No focal deficit present.     Mental Status: He is alert and oriented to person, place, and time.     Cranial Nerves: No cranial nerve deficit.     Motor: No weakness.     Deep Tendon Reflexes: Reflexes normal.   Psychiatric:        Mood and Affect: Mood normal.        Behavior: Behavior normal.        Thought Content: Thought content normal.        Judgment: Judgment normal.    Results for orders placed  or performed in visit on 03/01/23  Uric acid   Collection Time: 03/01/23  9:55 AM  Result Value Ref Range   Uric Acid 5.9 3.8 - 8.4 mg/dL  Comp Met (CMET)   Collection Time: 03/01/23  9:55 AM  Result Value Ref Range   Glucose 129 (H) 70 - 99 mg/dL   BUN 11 8 - 27 mg/dL   Creatinine, Ser 8.87 0.76 - 1.27 mg/dL   eGFR 69 >40 fO/fpw/8.26   BUN/Creatinine Ratio 10 10 - 24   Sodium 138 134 -  144 mmol/L   Potassium 4.5 3.5 - 5.2 mmol/L   Chloride 105 96 - 106 mmol/L   CO2 21 20 - 29 mmol/L   Calcium  9.3 8.6 - 10.2 mg/dL   Total Protein 7.2 6.0 - 8.5 g/dL   Albumin 4.1 3.8 - 4.8 g/dL   Globulin, Total 3.1 1.5 - 4.5 g/dL   Bilirubin Total 0.5 0.0 - 1.2 mg/dL   Alkaline Phosphatase 95 44 - 121 IU/L   AST 20 0 - 40 IU/L   ALT 18 0 - 44 IU/L      Assessment & Plan:   Problem List Items Addressed This Visit   None     Discussed aspirin prophylaxis for myocardial infarction prevention and decision was it was not indicated  LABORATORY TESTING:  Health maintenance labs ordered today as discussed above.   The natural history of prostate cancer and ongoing controversy regarding screening and potential treatment outcomes of prostate cancer has been discussed with the patient. The meaning of a false positive PSA and a false negative PSA has been discussed. He indicates understanding of the limitations of this screening test and wishes to proceed with screening PSA testing.   IMMUNIZATIONS:   - Tdap: Tetanus vaccination status reviewed: last tetanus booster within 10 years. - Influenza: Postponed to flu season - Pneumovax: Up to date - Prevnar: Up to date - COVID: Up to date - HPV: Not applicable - Shingrix vaccine: Discussed at visit today  SCREENING: - Colonoscopy: Up to date  Discussed with patient purpose of the colonoscopy is to detect colon cancer at curable precancerous or early stages   - AAA Screening: Not applicable  -Hearing Test: Not applicable  -Spirometry: Not applicable   PATIENT COUNSELING:    Sexuality: Discussed sexually transmitted diseases, partner selection, use of condoms, avoidance of unintended pregnancy  and contraceptive alternatives.   Advised to avoid cigarette smoking.  I discussed with the patient that most people either abstain from alcohol or drink within safe limits (<=14/week and <=4 drinks/occasion for males, <=7/weeks and <= 3  drinks/occasion for females) and that the risk for alcohol disorders and other health effects rises proportionally with the number of drinks per week and how often a drinker exceeds daily limits.  Discussed cessation/primary prevention of drug use and availability of treatment for abuse.   Diet: Encouraged to adjust caloric intake to maintain  or achieve ideal body weight, to reduce intake of dietary saturated fat and total fat, to limit sodium intake by avoiding high sodium foods and not adding table salt, and to maintain adequate dietary potassium and calcium  preferably from fresh fruits, vegetables, and low-fat dairy products.    stressed the importance of regular exercise  Injury prevention: Discussed safety belts, safety helmets, smoke detector, smoking near bedding or upholstery.   Dental health: Discussed importance of regular tooth brushing, flossing, and dental visits.   Follow up plan: NEXT PREVENTATIVE PHYSICAL DUE IN 1 YEAR.  No follow-ups on file.

## 2023-11-06 ENCOUNTER — Ambulatory Visit: Admitting: Nurse Practitioner

## 2023-11-08 ENCOUNTER — Ambulatory Visit: Admitting: Emergency Medicine

## 2023-11-08 VITALS — Ht 68.0 in | Wt 256.0 lb

## 2023-11-08 DIAGNOSIS — Z0000 Encounter for general adult medical examination without abnormal findings: Secondary | ICD-10-CM

## 2023-11-08 DIAGNOSIS — Z599 Problem related to housing and economic circumstances, unspecified: Secondary | ICD-10-CM

## 2023-11-08 NOTE — Progress Notes (Signed)
 Subjective:   Luis Clark is a 73 y.o. who presents for a Medicare Wellness preventive visit.  As a reminder, Annual Wellness Visits don't include a physical exam, and some assessments may be limited, especially if this visit is performed virtually. We may recommend an in-person follow-up visit with your provider if needed.  Visit Complete: Virtual I connected with  Luis Clark on 11/08/23 by a audio enabled telemedicine application and verified that I am speaking with the correct person using two identifiers.  Patient Location: Home  Provider Location: Home Office  I discussed the limitations of evaluation and management by telemedicine. The patient expressed understanding and agreed to proceed.  Vital Signs: Because this visit was a virtual/telehealth visit, some criteria may be missing or patient reported. Any vitals not documented were not able to be obtained and vitals that have been documented are patient reported.  VideoDeclined- This patient declined Librarian, academic. Therefore the visit was completed with audio only.  Persons Participating in Visit: Patient.  AWV Questionnaire: No: Patient Medicare AWV questionnaire was not completed prior to this visit.  Cardiac Risk Factors include: advanced age (>6men, >19 women);male gender;hypertension;obesity (BMI >30kg/m2)     Objective:    Today's Vitals   11/08/23 1046 11/08/23 1047  Weight: 256 lb (116.1 kg)   Height: 5' 8 (1.727 m)   PainSc:  8    Body mass index is 38.92 kg/m.     11/08/2023   11:12 AM 10/30/2022    1:08 PM 11/16/2021   12:24 PM 10/27/2021    9:06 AM 09/10/2020    8:00 AM 09/07/2020    9:53 AM  Advanced Directives  Does Patient Have a Medical Advance Directive? Yes No Yes Yes Yes Yes  Type of Estate agent of [place];Living will  Healthcare Power of [employer] Power of [employer] Power of [place];Living will Healthcare Power of  [place];Living will  Does patient want to make changes to medical advance directive? No - Patient declined    No - Patient declined   Copy of Healthcare Power of Attorney in Chart? No - copy requested   Yes - validated most recent copy scanned in chart (See row information) No - copy requested No - copy requested  Would patient like information on creating a medical advance directive?  No - Patient declined        Current Medications (verified) Outpatient Encounter Medications as of 11/08/2023  Medication Sig   allopurinol  (ZYLOPRIM ) 100 MG tablet Take 1 tablet (100 mg total) by mouth daily.   amLODipine  (NORVASC ) 10 MG tablet Take 1 tablet (10 mg total) by mouth daily.   atorvastatin  (LIPITOR) 20 MG tablet TAKE 1 TABLET BY MOUTH DAILY   DULoxetine  (CYMBALTA ) 60 MG capsule Take 1 capsule (60 mg total) by mouth daily.   gabapentin  (NEURONTIN ) 600 MG tablet Take 1 tablet (600 mg total) by mouth 3 (three) times daily. (Patient taking differently: Take 600 mg by mouth 3 (three) times daily. Taking 1 tablet twice daily)   LUMIGAN 0.01 % SOLN Place 1 drop into both eyes at bedtime.    Misc Natural Products (BEET ROOT) 500 MG CAPS Take 1 capsule by mouth in the morning and at bedtime.   omeprazole  (PRILOSEC) 20 MG capsule Take 1 capsule (20 mg total) by mouth daily.   tamsulosin  (FLOMAX ) 0.4 MG CAPS capsule Take 1 capsule (0.4 mg total) by mouth daily.   tiZANidine  (ZANAFLEX ) 4 MG tablet Take  1 tablet (4 mg total) by mouth every 6 (six) hours as needed for muscle spasms.   Multiple Vitamin (MULTIVITAMIN) tablet Take 1 tablet by mouth daily. (Patient not taking: Reported on 11/08/2023)   VIAGRA  50 MG tablet Take 1 tablet (50 mg total) by mouth daily as needed for erectile dysfunction. (Patient not taking: Reported on 11/08/2023)   No facility-administered encounter medications on file as of 11/08/2023.    Allergies (verified) Penicillin g and Penicillins   History: Past Medical History:  Diagnosis  Date   Cataract    Enlarged prostate    GERD (gastroesophageal reflux disease)    HLD (hyperlipidemia)    Hypertension    Neuropathic pain    Thoracic stenosis    Past Surgical History:  Procedure Laterality Date   BACK SURGERY     COLONOSCOPY WITH PROPOFOL  N/A 11/16/2021   Procedure: COLONOSCOPY WITH PROPOFOL ;  Surgeon: Therisa Bi, MD;  Location: [facility] ENDOSCOPY;  Service: Gastroenterology;  Laterality: N/A;   KNEE SURGERY     LUMBAR LAMINECTOMY/DECOMPRESSION MICRODISCECTOMY N/A 09/09/2020   Procedure: Thoracic eleven-twelve Laminectomy;  Surgeon: Gillie Duncans, MD;  Location: [facility] OR;  Service: Neurosurgery;  Laterality: N/A;   Family History  Problem Relation Age of Onset   Cancer Mother    Diabetes Mother    Hypertension Mother    Heart disease Father    Hypertension Father    Cancer Father    Social History   Socioeconomic History   Marital status: Widowed    Spouse name: Not on file   Number of children: 3   Years of education: Not on file   Highest education level: Not on file  Occupational History   Not on file  Tobacco Use   Smoking status: Former    Current packs/day: 0.00    Average packs/day: 0.2 packs/day for 40.0 years (6.0 ttl pk-yrs)    Types: Cigarettes    Start date: 81    Quit date: 2014    Years since quitting: 11.5   Smokeless tobacco: Never   Tobacco comments:    Smoked 10-15 cigarettes per week  Vaping Use   Vaping status: Never Used  Substance and Sexual Activity   Alcohol use: Not Currently   Drug use: Not Currently   Sexual activity: Yes  Other Topics Concern   Not on file  Social History Narrative   Not on file   Social Drivers of Health   Financial Resource Strain: High Risk (11/08/2023)   Overall Financial Resource Strain (CARDIA)    Difficulty of Paying Living Expenses: Hard  Food Insecurity: No Food Insecurity (11/08/2023)   Hunger Vital Sign    Worried About Running Out of Food in the Last Year: Never true    Ran Out of  Food in the Last Year: Never true  Transportation Needs: No Transportation Needs (11/08/2023)   PRAPARE - Administrator, Civil Service (Medical): No    Lack of Transportation (Non-Medical): No  Physical Activity: Insufficiently Active (11/08/2023)   Exercise Vital Sign    Days of Exercise per Week: 4 days    Minutes of Exercise per Session: 30 min  Stress: No Stress Concern Present (11/08/2023)   [employer] of Occupational Health - Occupational Stress Questionnaire    Feeling of Stress: Only a little  Social Connections: Moderately Integrated (11/08/2023)   Social Connection and Isolation Panel    Frequency of Communication with Friends and Family: Three times a week    Frequency of  Social Gatherings with Friends and Family: More than three times a week    Attends Religious Services: More than 4 times per year    Active Member of [employer] or Organizations: Yes    Attends Banker Meetings: More than 4 times per year    Marital Status: Widowed    Tobacco Counseling Counseling given: Not Answered Tobacco comments: Smoked 10-15 cigarettes per week    Clinical Intake:  Pre-visit preparation completed: Yes  Pain : 0-10 Pain Score: 8  Pain Type: Neuropathic pain Pain Location: Leg Pain Orientation: Left, Right Pain Descriptors / Indicators: Tingling     BMI - recorded: 38.92 Nutritional Status: BMI > 30  Obese Nutritional Risks: None Diabetes: No  Lab Results  Component Value Date   HGBA1C 5.6 12/04/2022     How often do you need to have someone help you when you read instructions, pamphlets, or other written materials from your doctor or pharmacy?: 1 - Never  Interpreter Needed?: No  Information entered by :: Vina Ned, CMA   Activities of Daily Living     11/08/2023   10:52 AM  In your present state of health, do you have any difficulty performing the following activities:  Hearing? 0  Vision? 0  Difficulty concentrating or making  decisions? 0  Walking or climbing stairs? 1  Comment uses cane, rolling walker and rollator  Dressing or bathing? 1  Doing errands, shopping? 1  Comment sometimes when neuropathy is bad  Quarry manager and eating ? N  Using the Toilet? N  In the past six months, have you accidently leaked urine? Y  Comment due to enlarged prostate  Do you have problems with loss of bowel control? N  Managing your Medications? N  Managing your Finances? N  Housekeeping or managing your Housekeeping? N    Patient Care Team: Melvin Pao, NP as PCP - General (Nurse Practitioner) Roni, [facility] (Optometry)  I have updated your Care Teams any recent Medical Services you may have received from other providers in the past year.     Assessment:   This is a routine wellness examination for [facility].  Hearing/Vision screen Hearing Screening - Comments:: Denies hearing loss  Vision Screening - Comments:: Gets routine eye exams, Dr. Mevelyn Molly, [place]   Goals Addressed             This Visit's Progress    Patient Stated       Work on remaining independent       Depression Screen     11/08/2023   11:06 AM 03/01/2023    9:36 AM 12/04/2022   10:00 AM 10/30/2022    1:06 PM 11/22/2021    8:26 AM 10/27/2021    9:20 AM  PHQ 2/9 Scores  PHQ - 2 Score 1 1 3 2 1  0  PHQ- 9 Score 3 1 10 4 4 4     Fall Risk     11/08/2023   11:13 AM 03/01/2023    9:36 AM 12/04/2022    9:59 AM 10/30/2022    1:09 PM 11/22/2021    8:25 AM  Fall Risk   Falls in the past year? 1 1 0 1 1  Number falls in past yr: 1 1 0 1 0  Injury with Fall? 0 0 0 0 0  Risk for fall due to : History of fall(s);Impaired balance/gait;Orthopedic patient;Impaired mobility Impaired balance/gait Impaired balance/gait History of fall(s);Impaired balance/gait History of fall(s)  Follow up Falls  evaluation completed;Education provided Falls evaluation completed Falls evaluation completed Falls evaluation completed;Falls prevention  discussed Falls evaluation completed      Data saved with a previous flowsheet row definition    MEDICARE RISK AT HOME:  Medicare Risk at Home Any stairs in or around the home?: Yes If so, are there any without handrails?: No Home free of loose throw rugs in walkways, pet beds, electrical cords, etc?: Yes Adequate lighting in your home to reduce risk of falls?: Yes Life alert?: Yes Use of a cane, walker or w/c?: Yes (cane, rolling walker and rollator) Grab bars in the bathroom?: No Shower chair or bench in shower?: No Elevated toilet seat or a handicapped toilet?: No  TIMED UP AND GO:  Was the test performed?  No  Cognitive Function: 6CIT completed        11/08/2023   11:15 AM 10/30/2022    1:11 PM 10/27/2021    9:08 AM  6CIT Screen  What Year? 0 points 0 points 0 points  What month? 0 points 0 points 0 points  What time? 0 points 0 points 0 points  Count back from 20 0 points 0 points 0 points  Months in reverse 0 points 2 points 0 points  Repeat phrase 2 points 0 points 0 points  Total Score 2 points 2 points 0 points    Immunizations Immunization History  Administered Date(s) Administered   Influenza, High Dose Seasonal PF 03/09/2015, 04/08/2015, 02/15/2023   Influenza, Seasonal, Injecte, Preservative Fre 03/12/2012   Influenza,inj,Quad PF,6+ Mos 06/21/2016   Influenza-Unspecified 02/05/2010, 02/27/2011, 06/08/2014   PNEUMOCOCCAL CONJUGATE-20 11/22/2021   Pneumococcal Conjugate-13 08/21/2014   Pneumococcal Polysaccharide-23 06/21/2016   Tdap 06/30/2011, 11/22/2021    Screening Tests Health Maintenance  Topic Date Due   Zoster Vaccines- Shingrix (1 of 2) Never done   COVID-19 Vaccine (1 - 2024-25 season) Never done   INFLUENZA VACCINE  12/07/2023   Medicare Annual Wellness (AWV)  11/07/2024   Colonoscopy  11/16/2024   DTaP/Tdap/Td (3 - Td or Tdap) 11/23/2031   Pneumococcal Vaccine: 50+ Years  Completed   Hepatitis C Screening  Completed   Hepatitis B  Vaccines  Aged Out   HPV VACCINES  Aged Out   Meningococcal B Vaccine  Aged Out    Health Maintenance  Health Maintenance Due  Topic Date Due   Zoster Vaccines- Shingrix (1 of 2) Never done   COVID-19 Vaccine (1 - 2024-25 season) Never done   Health Maintenance Items Addressed: See Nurse Notes at the end of this note  Additional Screening:  Vision Screening: Recommended annual ophthalmology exams for early detection of glaucoma and other disorders of the eye. Would you like a referral to an eye doctor? No    Dental Screening: Recommended annual dental exams for proper oral hygiene  Community Resource Referral / Chronic Care Management: CRR required this visit?  Yes   CCM required this visit?  No   Plan:    I have personally reviewed and noted the following in the patient's chart:   Medical and social history Use of alcohol, tobacco or illicit drugs  Current medications and supplements including opioid prescriptions. Patient is not currently taking opioid prescriptions. Functional ability and status Nutritional status Physical activity Advanced directives List of other physicians Hospitalizations, surgeries, and ER visits in previous 12 months Vitals Screenings to include cognitive, depression, and falls Referrals and appointments  In addition, I have reviewed and discussed with patient certain preventive protocols, quality metrics, and best  practice recommendations. A written personalized care plan for preventive services as well as general preventive health recommendations were provided to patient.   Vina Ned, CMA   11/08/2023   After Visit Summary: (Mail) Due to this being a telephonic visit, the after visit summary with patients personalized plan was offered to patient via mail   Notes:  6 CIT Score - 2 Placed Community Care Referral due to financial difficulties Needs 2nd Shingrix vaccine Declined Covid vaccine

## 2023-11-08 NOTE — Patient Instructions (Signed)
 Mr. Luis Clark , Thank you for taking time out of your busy schedule to complete your Annual Wellness Visit with me. I enjoyed our conversation and look forward to speaking with you again next year. I, as well as your care team,  appreciate your ongoing commitment to your health goals. Please review the following plan we discussed and let me know if I can assist you in the future. Your Game plan/ To Do List    Referrals: A referral has been placed for you to check and see what additional resources are available to you to help with rent If you haven't heard from anyone within the next 7 business days, please call them and let them know a referral has been placed for you Phone: [phone]  Follow up Visits: Next Medicare AWV with our clinical staff: 11/20/24 @ 11:20am (PHONE VISIT)   Have you seen your provider in the last 6 months (3 months if uncontrolled diabetes)? No Next Office Visit with your provider: 11/15/23 @ 10:20am with Darice Petty, NP  Clinician Recommendations: Get the 2nd Shingrix vaccine. Call and schedule a routine eye exam.  Aim for 30 minutes of exercise or brisk walking, 6-8 glasses of water, and 5 servings of fruits and vegetables each day.       This is a list of the screening recommended for you and due dates:  Health Maintenance  Topic Date Due   Zoster (Shingles) Vaccine (1 of 2) Never done   COVID-19 Vaccine (1 - 2024-25 season) Never done   Flu Shot  12/07/2023   Medicare Annual Wellness Visit  11/07/2024   Colon Cancer Screening  11/16/2024   DTaP/Tdap/Td vaccine (3 - Td or Tdap) 11/23/2031   Pneumococcal Vaccine for age over 15  Completed   Hepatitis C Screening  Completed   Hepatitis B Vaccine  Aged Out   HPV Vaccine  Aged Out   Meningitis B Vaccine  Aged Out    Advanced directives: (Copy Requested) Please bring a copy of your health care power of attorney and living will to the office to be added to your chart at your convenience. You can mail to [facility] [address] [place], [place] [address] or email to [email or web address] Advance Care Planning is important because it:  [x]  Makes sure you receive the medical care that is consistent with your values, goals, and preferences  [x]  It provides guidance to your family and loved ones and reduces their decisional burden about whether or not they are making the right decisions based on your wishes.  Follow the link provided in your after visit summary or read over the paperwork we have mailed to you to help you started getting your Advance Directives in place. If you need assistance in completing these, please reach out to us  so that we can help you!  See attachments for Preventive Care and Fall Prevention Tips.   Fall Prevention in the Home, Adult Falls can cause injuries and affect people of all ages. There are many simple things that you can do to make your home safe and to help prevent falls. If you need it, ask for help making these changes. What actions can I take to prevent falls? General information Use good lighting in all rooms. Make sure to: Replace any light bulbs that burn out. Turn on lights if it is dark and use night-lights. Keep items that you use often in easy-to-reach places. Lower the shelves around your home if needed.  Move furniture so that there are clear paths around it. Do not keep throw rugs or other things on the floor that can make you trip. If any of your floors are uneven, fix them. Add color or contrast paint or tape to clearly mark and help you see: Grab bars or handrails. First and last steps of staircases. Where the edge of each step is. If you use a ladder or stepladder: Make sure that it is fully opened. Do not climb a closed ladder. Make sure the sides of the ladder are locked in place. Have someone hold the ladder while you use it. Know where your pets are as you move through your home. What can I do in the  bathroom?     Keep the floor dry. Clean up any water that is on the floor right away. Remove soap buildup in the bathtub or shower. Buildup makes bathtubs and showers slippery. Use non-skid mats or decals on the floor of the bathtub or shower. Attach bath mats securely with double-sided, non-slip rug tape. If you need to sit down while you are in the shower, use a non-slip stool. Install grab bars by the toilet and in the bathtub and shower. Do not use towel bars as grab bars. What can I do in the bedroom? Make sure that you have a light by your bed that is easy to reach. Do not use any sheets or blankets on your bed that hang to the floor. Have a firm bench or chair with side arms that you can use for support when you get dressed. What can I do in the kitchen? Clean up any spills right away. If you need to reach something above you, use a sturdy step stool that has a grab bar. Keep electrical cables out of the way. Do not use floor polish or wax that makes floors slippery. What can I do with my stairs? Do not leave anything on the stairs. Make sure that you have a light switch at the top and the bottom of the stairs. Have them installed if you do not have them. Make sure that there are handrails on both sides of the stairs. Fix handrails that are broken or loose. Make sure that handrails are as long as the staircases. Install non-slip stair treads on all stairs in your home if they do not have carpet. Avoid having throw rugs at the top or bottom of stairs, or secure the rugs with carpet tape to prevent them from moving. Choose a carpet design that does not hide the edge of steps on the stairs. Make sure that carpet is firmly attached to the stairs. Fix any carpet that is loose or worn. What can I do on the outside of my home? Use bright outdoor lighting. Repair the edges of walkways and driveways and fix any cracks. Clear paths of anything that can make you trip, such as tools or  rocks. Add color or contrast paint or tape to clearly mark and help you see high doorway thresholds. Trim any bushes or trees on the main path into your home. Check that handrails are securely fastened and in good repair. Both sides of all steps should have handrails. Install guardrails along the edges of any raised decks or porches. Have leaves, snow, and ice cleared regularly. Use sand, salt, or ice melt on walkways during winter months if you live where there is ice and snow. In the garage, clean up any spills right away, including grease or oil  spills. What other actions can I take? Review your medicines with your health care provider. Some medicines can make you confused or feel dizzy. This can increase your chance of falling. Wear closed-toe shoes that fit well and support your feet. Wear shoes that have rubber soles and low heels. Use a cane, walker, scooter, or crutches that help you move around if needed. Talk with your provider about other ways that you can decrease your risk of falls. This may include seeing a physical therapist to learn to do exercises to improve movement and strength. Where to find more information Centers for Disease Control and Prevention, STEADI: TonerPromos.no [employer] on Aging: BaseRingTones.pl National Institute on Aging: BaseRingTones.pl Contact a health care provider if: You are afraid of falling at home. You feel weak, drowsy, or dizzy at home. You fall at home. Get help right away if you: Lose consciousness or have trouble moving after a fall. Have a fall that causes a head injury. These symptoms may be an emergency. Get help right away. Call 911. Do not wait to see if the symptoms will go away. Do not drive yourself to the hospital. This information is not intended to replace advice given to you by your health care provider. Make sure you discuss any questions you have with your health care provider. Document Revised: 12/26/2021 Document Reviewed:  12/26/2021 Elsevier Patient Education  2024 [employer].

## 2023-11-15 ENCOUNTER — Ambulatory Visit: Admitting: Nurse Practitioner

## 2023-11-15 ENCOUNTER — Telehealth: Payer: Self-pay | Admitting: *Deleted

## 2023-11-15 NOTE — Progress Notes (Signed)
 Complex Care Management Note Care Guide Note  11/15/2023 Name: Luis Clark MRN: [id number] DOB: 12-16-1950   Complex Care Management Outreach Attempts: An unsuccessful telephone outreach was attempted today to offer the patient information about available complex care management services.  Follow Up Plan:  Additional outreach attempts will be made to offer the patient complex care management information and services.   Encounter Outcome:  No Answer  [facility] HealthPopulation Health Care Guide  Direct [phone] Fax:[phone] Website: [email or web address]

## 2023-11-16 ENCOUNTER — Telehealth: Payer: Self-pay | Admitting: *Deleted

## 2023-11-16 NOTE — Progress Notes (Signed)
 Complex Care Management Note Care Guide Note  11/16/2023 Name: Luis Clark MRN: [id number] DOB: [birth year]   Complex Care Management Outreach Attempts: An unsuccessful telephone outreach was attempted today to offer the patient information about available complex care management services.  Follow Up Plan:  Additional outreach attempts will be made to offer the patient complex care management information and services.   Encounter Outcome:  No Answer [facility] HealthPopulation Health Care Guide  Direct Dial:[phone] Fax:[phone] Website: [email or web address]

## 2023-11-19 ENCOUNTER — Telehealth: Payer: Self-pay | Admitting: *Deleted

## 2023-11-19 NOTE — Progress Notes (Signed)
 Complex Care Management Note Care Guide Note  11/19/2023 Name: Luis Clark MRN: [id number] DOB: [birth year]   Complex Care Management Outreach Attempts: An unsuccessful telephone outreach was attempted today to offer the patient information about available complex care management services.  Follow Up Plan:  No further outreach attempts will be made at this time. We have been unable to contact the patient to offer or enroll patient in complex care management services.  Encounter Outcome:  No Answer  Voicemail is full  [facility] HealthPopulation Health Care Guide  Direct [phone] Fax:[phone] Website: [email or web address]

## 2023-11-28 ENCOUNTER — Ambulatory Visit: Admitting: Nurse Practitioner

## 2023-11-28 ENCOUNTER — Encounter: Payer: Self-pay | Admitting: Nurse Practitioner

## 2023-11-28 VITALS — BP 114/72 | HR 99 | Temp 98.1°F | Ht 67.6 in | Wt 262.8 lb

## 2023-11-28 DIAGNOSIS — I10 Essential (primary) hypertension: Secondary | ICD-10-CM

## 2023-11-28 DIAGNOSIS — M10071 Idiopathic gout, right ankle and foot: Secondary | ICD-10-CM | POA: Diagnosis not present

## 2023-11-28 DIAGNOSIS — M792 Neuralgia and neuritis, unspecified: Secondary | ICD-10-CM | POA: Diagnosis not present

## 2023-11-28 DIAGNOSIS — N529 Male erectile dysfunction, unspecified: Secondary | ICD-10-CM

## 2023-11-28 DIAGNOSIS — Z0000 Encounter for general adult medical examination without abnormal findings: Secondary | ICD-10-CM

## 2023-11-28 DIAGNOSIS — E782 Mixed hyperlipidemia: Secondary | ICD-10-CM

## 2023-11-28 DIAGNOSIS — G8194 Hemiplegia, unspecified affecting left nondominant side: Secondary | ICD-10-CM

## 2023-11-28 MED ORDER — ATORVASTATIN CALCIUM 20 MG PO TABS: 20.0000 mg | ORAL_TABLET | Freq: Every day | ORAL | 1 refills | Status: DC

## 2023-11-28 MED ORDER — TAMSULOSIN HCL 0.4 MG PO CAPS: 0.4000 mg | ORAL_CAPSULE | Freq: Every day | ORAL | 1 refills | Status: DC

## 2023-11-28 MED ORDER — ALLOPURINOL 100 MG PO TABS: 100.0000 mg | ORAL_TABLET | Freq: Every day | ORAL | 1 refills | Status: DC

## 2023-11-28 MED ORDER — PREGABALIN 25 MG PO CAPS: 25.0000 mg | ORAL_CAPSULE | Freq: Two times a day (BID) | ORAL | 1 refills | Status: DC

## 2023-11-28 MED ORDER — OMEPRAZOLE 20 MG PO CPDR: 20.0000 mg | DELAYED_RELEASE_CAPSULE | Freq: Every day | ORAL | 1 refills | Status: DC

## 2023-11-28 MED ORDER — SILDENAFIL CITRATE 50 MG PO TABS: 50.0000 mg | ORAL_TABLET | Freq: Every day | ORAL | 12 refills | Status: AC | PRN

## 2023-11-28 MED ORDER — DULOXETINE HCL 60 MG PO CPEP: 60.0000 mg | ORAL_CAPSULE | Freq: Every day | ORAL | 1 refills | Status: DC

## 2023-11-28 MED ORDER — AMLODIPINE BESYLATE 10 MG PO TABS: 10.0000 mg | ORAL_TABLET | Freq: Every day | ORAL | 1 refills | Status: DC

## 2023-11-28 NOTE — Assessment & Plan Note (Signed)
 Ongoing issue.  Not well controlled with Gabapentin  600mg  TID.  Will change to Pregabalin  25mg  BID.  Follow up in 2 months.  Call sooner if concerns arise.

## 2023-11-28 NOTE — Assessment & Plan Note (Addendum)
 Chronic.  Controlled.  Continue with current medication regimen of Amlodipine  10 mg, refills sent.  Labs ordered today.  Return to clinic in 6 months for reevaluation.  Call sooner if concerns arise.

## 2023-11-28 NOTE — Progress Notes (Signed)
 BP 114/72 (BP Location: Left Arm, Cuff Size: Large)   Pulse 99   Temp 98.1 F (36.7 C) (Oral)   Ht 5' 7.6 (1.717 m)   Wt 262 lb 12.8 oz (119.2 kg)   SpO2 93%   BMI 40.43 kg/m    Subjective:    Patient ID: Luis Clark, male    DOB: [birth year], 73 y.o.   MRN: [id number]  HPI: Luis Clark is a 73 y.o. male presenting on 11/28/2023 for comprehensive medical examination. Current medical complaints include:referral to neurology  He currently lives with: Interim Problems from his last visit: no  HYPERTENSION / HYPERLIPIDEMIA Taking Atorvastatin  20 mg and Amlodipine  10 mg daily Satisfied with current treatment? yes Duration of hypertension: chronic BP monitoring frequency: a few times a week x2 weekly BP range: 120/70 BP medication side effects: no Duration of hyperlipidemia: chronic Cholesterol medication side effects: no Cholesterol supplements: none Medication compliance: excellent compliance Aspirin: no Recent stressors: no Recurrent headaches: no Visual changes: no Palpitations: no Dyspnea: no Chest pain: no Lower extremity edema: no Dizzy/lightheaded: no   Patient states he is having more neuropathic pain.  The gabapentin  isn't helping with the pain anymore.  Continues with left sided weakness.     Depression Screen done today and results listed below:     11/28/2023    9:40 AM 11/08/2023   11:06 AM 03/01/2023    9:36 AM 12/04/2022   10:00 AM 10/30/2022    1:06 PM  Depression screen PHQ 2/9  Decreased Interest 2 0 1 2 1   Down, Depressed, Hopeless 2 1 0 1 1  PHQ - 2 Score 4 1 1 3 2   Altered sleeping 2 1 0 1 0  Tired, decreased energy 2 1 0 2 1  Change in appetite 0 0 0 1 0  Feeling bad or failure about yourself  0 0 0 1 1  Trouble concentrating 0 0 0 1 0  Moving slowly or fidgety/restless 0 0 0 1 0  Suicidal thoughts 0 0 0 0 0  PHQ-9 Score 8 3 1 10 4   Difficult doing work/chores Not difficult at all Not difficult at all Not difficult at all Somewhat  difficult Not difficult at all    The patient does a history of falls. I did complete a risk assessment for falls. A plan of care for falls was documented.   Past Medical History:  Past Medical History:  Diagnosis Date   Cataract    Enlarged prostate    GERD (gastroesophageal reflux disease)    HLD (hyperlipidemia)    Hypertension    Neuropathic pain    Thoracic stenosis     Surgical History:  Past Surgical History:  Procedure Laterality Date   BACK SURGERY     COLONOSCOPY WITH PROPOFOL  N/A 11/16/2021   Procedure: COLONOSCOPY WITH PROPOFOL ;  Surgeon: Therisa Bi, MD;  Location: [facility] ENDOSCOPY;  Service: Gastroenterology;  Laterality: N/A;   KNEE SURGERY     LUMBAR LAMINECTOMY/DECOMPRESSION MICRODISCECTOMY N/A 09/09/2020   Procedure: Thoracic eleven-twelve Laminectomy;  Surgeon: Gillie Duncans, MD;  Location: [facility] OR;  Service: Neurosurgery;  Laterality: N/A;    Medications:  Current Outpatient Medications on File Prior to Visit  Medication Sig   LUMIGAN 0.01 % SOLN Place 1 drop into both eyes at bedtime.    Misc Natural Products (BEET ROOT) 500 MG CAPS Take 1 capsule by mouth in the morning and at bedtime.   tiZANidine  (ZANAFLEX ) 4 MG tablet Take 1 tablet (4  mg total) by mouth every 6 (six) hours as needed for muscle spasms.   No current facility-administered medications on file prior to visit.    Allergies:  Allergies  Allergen Reactions   Penicillin G Swelling   Penicillins Swelling    Inflammation  73 years old    Social History:  Social History   Socioeconomic History   Marital status: Widowed    Spouse name: Not on file   Number of children: 3   Years of education: Not on file   Highest education level: Not on file  Occupational History   Not on file  Tobacco Use   Smoking status: Former    Current packs/day: 0.00    Average packs/day: 0.2 packs/day for 40.0 years (6.0 ttl pk-yrs)    Types: Cigarettes    Start date: 24    Quit date: 2014    Years  since quitting: 11.5   Smokeless tobacco: Never   Tobacco comments:    Smoked 10-15 cigarettes per week  Vaping Use   Vaping status: Never Used  Substance and Sexual Activity   Alcohol use: Not Currently   Drug use: Not Currently   Sexual activity: Yes  Other Topics Concern   Not on file  Social History Narrative   Not on file   Social Drivers of Health   Financial Resource Strain: High Risk (11/08/2023)   Overall Financial Resource Strain (CARDIA)    Difficulty of Paying Living Expenses: Hard  Food Insecurity: No Food Insecurity (11/08/2023)   Hunger Vital Sign    Worried About Running Out of Food in the Last Year: Never true    Ran Out of Food in the Last Year: Never true  Transportation Needs: No Transportation Needs (11/08/2023)   PRAPARE - Administrator, Civil Service (Medical): No    Lack of Transportation (Non-Medical): No  Physical Activity: Insufficiently Active (11/08/2023)   Exercise Vital Sign    Days of Exercise per Week: 4 days    Minutes of Exercise per Session: 30 min  Stress: No Stress Concern Present (11/08/2023)   [employer] of Occupational Health - Occupational Stress Questionnaire    Feeling of Stress: Only a little  Social Connections: Moderately Integrated (11/08/2023)   Social Connection and Isolation Panel    Frequency of Communication with Friends and Family: Three times a week    Frequency of Social Gatherings with Friends and Family: More than three times a week    Attends Religious Services: More than 4 times per year    Active Member of Clubs or Organizations: Yes    Attends Banker Meetings: More than 4 times per year    Marital Status: Widowed  Intimate Partner Violence: Not At Risk (11/08/2023)   Humiliation, Afraid, Rape, and Kick questionnaire    Fear of Current or Ex-Partner: No    Emotionally Abused: No    Physically Abused: No    Sexually Abused: No   Social History   Tobacco Use  Smoking Status Former    Current packs/day: 0.00   Average packs/day: 0.2 packs/day for 40.0 years (6.0 ttl pk-yrs)   Types: Cigarettes   Start date: 52   Quit date: 2014   Years since quitting: 11.5  Smokeless Tobacco Never  Tobacco Comments   Smoked 10-15 cigarettes per week   Social History   Substance and Sexual Activity  Alcohol Use Not Currently    Family History:  Family History  Problem Relation Age of  Onset   Cancer Mother    Diabetes Mother    Hypertension Mother    Heart disease Father    Hypertension Father    Cancer Father     Past medical history, surgical history, medications, allergies, family history and social history reviewed with patient today and changes made to appropriate areas of the chart.   Review of Systems  Eyes:  Negative for blurred vision and double vision.  Respiratory:  Negative for shortness of breath.   Cardiovascular:  Negative for chest pain, palpitations and leg swelling.  Neurological:  Negative for dizziness and headaches.       Neuropathic pain in legs   All other ROS negative except what is listed above and in the HPI.      Objective:    BP 114/72 (BP Location: Left Arm, Cuff Size: Large)   Pulse 99   Temp 98.1 F (36.7 C) (Oral)   Ht 5' 7.6 (1.717 m)   Wt 262 lb 12.8 oz (119.2 kg)   SpO2 93%   BMI 40.43 kg/m   Wt Readings from Last 3 Encounters:  11/28/23 262 lb 12.8 oz (119.2 kg)  11/08/23 256 lb (116.1 kg)  03/01/23 263 lb 12.8 oz (119.7 kg)    Physical Exam Vitals and nursing note reviewed.  Constitutional:      General: He is not in acute distress.    Appearance: Normal appearance. He is not ill-appearing, toxic-appearing or diaphoretic.  HENT:     Head: Normocephalic.     Right Ear: Tympanic membrane, ear canal and external ear normal.     Left Ear: Tympanic membrane, ear canal and external ear normal.     Nose: Nose normal. No congestion or rhinorrhea.     Mouth/Throat:     Mouth: Mucous membranes are moist.  Eyes:      General:        Right eye: No discharge.        Left eye: No discharge.     Extraocular Movements: Extraocular movements intact.     Conjunctiva/sclera: Conjunctivae normal.     Pupils: Pupils are equal, round, and reactive to light.  Cardiovascular:     Rate and Rhythm: Normal rate and regular rhythm.     Heart sounds: No murmur heard. Pulmonary:     Effort: Pulmonary effort is normal. No respiratory distress.     Breath sounds: Normal breath sounds. No wheezing, rhonchi or rales.  Abdominal:     General: Abdomen is flat. Bowel sounds are normal. There is no distension.     Palpations: Abdomen is soft.     Tenderness: There is no abdominal tenderness. There is no guarding.  Musculoskeletal:     Cervical back: Normal range of motion and neck supple.     Comments: Left sided weakness. Walks with walker  Skin:    General: Skin is warm and dry.     Capillary Refill: Capillary refill takes less than 2 seconds.  Neurological:     General: No focal deficit present.     Mental Status: He is alert and oriented to person, place, and time.     Cranial Nerves: No cranial nerve deficit.     Motor: No weakness.     Deep Tendon Reflexes: Reflexes normal.  Psychiatric:        Mood and Affect: Mood normal.        Behavior: Behavior normal.        Thought Content: Thought content normal.  Judgment: Judgment normal.     Results for orders placed or performed in visit on 03/01/23  Uric acid   Collection Time: 03/01/23  9:55 AM  Result Value Ref Range   Uric Acid 5.9 3.8 - 8.4 mg/dL  Comp Met (CMET)   Collection Time: 03/01/23  9:55 AM  Result Value Ref Range   Glucose 129 (H) 70 - 99 mg/dL   BUN 11 8 - 27 mg/dL   Creatinine, Ser 8.87 0.76 - 1.27 mg/dL   eGFR 69 >40 fO/fpw/8.26   BUN/Creatinine Ratio 10 10 - 24   Sodium 138 134 - 144 mmol/L   Potassium 4.5 3.5 - 5.2 mmol/L   Chloride 105 96 - 106 mmol/L   CO2 21 20 - 29 mmol/L   Calcium  9.3 8.6 - 10.2 mg/dL   Total Protein 7.2  6.0 - 8.5 g/dL   Albumin 4.1 3.8 - 4.8 g/dL   Globulin, Total 3.1 1.5 - 4.5 g/dL   Bilirubin Total 0.5 0.0 - 1.2 mg/dL   Alkaline Phosphatase 95 44 - 121 IU/L   AST 20 0 - 40 IU/L   ALT 18 0 - 44 IU/L      Assessment & Plan:   Problem List Items Addressed This Visit       Cardiovascular and Mediastinum   Hypertension   Chronic.  Controlled.  Continue with current medication regimen of Amlodipine  10 mg, refills sent.  Labs ordered today.  Return to clinic in 6 months for reevaluation.  Call sooner if concerns arise.       Relevant Medications   amLODipine  (NORVASC ) 10 MG tablet   atorvastatin  (LIPITOR) 20 MG tablet   sildenafil  (VIAGRA ) 50 MG tablet     Nervous and Auditory   Left hemiparesis (HCC)   Chronic.  Ongoing since stroke.  Walks with a walker. Plans to try and start exercising to help improve strength.        Other   Neuropathic pain   Ongoing issue.  Not well controlled with Gabapentin  600mg  TID.  Will change to Pregabalin  25mg  BID.  Follow up in 2 months.  Call sooner if concerns arise.       HLD (hyperlipidemia)   Chronic.  Controlled.  Continue with current medication regimen of Atorvastatin  20mg  daily, refills sent.  Labs ordered today.  Return to clinic in 6 months for reevaluation.  Call sooner if concerns arise.       Relevant Medications   amLODipine  (NORVASC ) 10 MG tablet   atorvastatin  (LIPITOR) 20 MG tablet   sildenafil  (VIAGRA ) 50 MG tablet   Other Relevant Orders   Lipid panel   Erectile dysfunction   Relevant Medications   sildenafil  (VIAGRA ) 50 MG tablet   Other Visit Diagnoses       Annual physical exam    -  Primary   Health maintenance reviewed during visit today.  Labs ordered.  Vaccines reviewed. Colonoscopy up to date.   Relevant Orders   TSH   PSA   Lipid panel   CBC with Differential/Platelet   Comprehensive metabolic panel with GFR     Acute idiopathic gout involving toe of right foot       Relevant Medications    allopurinol  (ZYLOPRIM ) 100 MG tablet        Discussed aspirin prophylaxis for myocardial infarction prevention and decision was it was not indicated  LABORATORY TESTING:  Health maintenance labs ordered today as discussed above.   The natural history of prostate cancer  and ongoing controversy regarding screening and potential treatment outcomes of prostate cancer has been discussed with the patient. The meaning of a false positive PSA and a false negative PSA has been discussed. He indicates understanding of the limitations of this screening test and wishes to proceed with screening PSA testing.   IMMUNIZATIONS:   - Tdap: Tetanus vaccination status reviewed: last tetanus booster within 10 years. - Influenza: Postponed to flu season - Pneumovax: Up to date - Prevnar: Up to date - COVID: Not applicable - HPV: Not applicable - Shingrix vaccine: Discussed at visit today  SCREENING: - Colonoscopy: Up to date  Discussed with patient purpose of the colonoscopy is to detect colon cancer at curable precancerous or early stages   - AAA Screening: Not applicable  -Hearing Test: Not applicable  -Spirometry: Not applicable   PATIENT COUNSELING:    Sexuality: Discussed sexually transmitted diseases, partner selection, use of condoms, avoidance of unintended pregnancy  and contraceptive alternatives.   Advised to avoid cigarette smoking.  I discussed with the patient that most people either abstain from alcohol or drink within safe limits (<=14/week and <=4 drinks/occasion for males, <=7/weeks and <= 3 drinks/occasion for females) and that the risk for alcohol disorders and other health effects rises proportionally with the number of drinks per week and how often a drinker exceeds daily limits.  Discussed cessation/primary prevention of drug use and availability of treatment for abuse.   Diet: Encouraged to adjust caloric intake to maintain  or achieve ideal body weight, to reduce intake of  dietary saturated fat and total fat, to limit sodium intake by avoiding high sodium foods and not adding table salt, and to maintain adequate dietary potassium and calcium  preferably from fresh fruits, vegetables, and low-fat dairy products.    stressed the importance of regular exercise  Injury prevention: Discussed safety belts, safety helmets, smoke detector, smoking near bedding or upholstery.   Dental health: Discussed importance of regular tooth brushing, flossing, and dental visits.   Follow up plan: NEXT PREVENTATIVE PHYSICAL DUE IN 1 YEAR. Return in about 2 months (around 01/29/2024) for Neuropathy follow up.

## 2023-11-28 NOTE — Assessment & Plan Note (Signed)
Chronic.  Controlled.  Continue with current medication regimen of Atorvastatin 20mg  daily, refills sent.  Labs ordered today.  Return to clinic in 6 months for reevaluation.  Call sooner if concerns arise.

## 2023-11-28 NOTE — Assessment & Plan Note (Signed)
 Chronic.  Ongoing since stroke.  Walks with a walker. Plans to try and start exercising to help improve strength.

## 2023-11-29 ENCOUNTER — Ambulatory Visit: Payer: Self-pay | Admitting: Nurse Practitioner

## 2023-11-29 LAB — COMPREHENSIVE METABOLIC PANEL WITH GFR
ALT: 19 IU/L (ref 0–44)
AST: 23 IU/L (ref 0–40)
Albumin: 4.1 g/dL (ref 3.8–4.8)
Alkaline Phosphatase: 99 IU/L (ref 44–121)
BUN/Creatinine Ratio: 10 (ref 10–24)
BUN: 11 mg/dL (ref 8–27)
Bilirubin Total: 0.4 mg/dL (ref 0.0–1.2)
CO2: 18 mmol/L — ABNORMAL LOW (ref 20–29)
Calcium: 9.4 mg/dL (ref 8.6–10.2)
Chloride: 106 mmol/L (ref 96–106)
Creatinine, Ser: 1.06 mg/dL (ref 0.76–1.27)
Globulin, Total: 3.3 g/dL (ref 1.5–4.5)
Glucose: 111 mg/dL — ABNORMAL HIGH (ref 70–99)
Potassium: 4.4 mmol/L (ref 3.5–5.2)
Sodium: 139 mmol/L (ref 134–144)
Total Protein: 7.4 g/dL (ref 6.0–8.5)
eGFR: 74 mL/min/1.73 (ref >59)

## 2023-11-29 LAB — CBC WITH DIFFERENTIAL/PLATELET
Basophils Absolute: 0 x10E3/uL (ref 0.0–0.2)
Basos: 1 %
EOS (ABSOLUTE): 0.4 x10E3/uL (ref 0.0–0.4)
Eos: 8 %
Hematocrit: 45.2 % (ref 37.5–51.0)
Hemoglobin: 13.4 g/dL (ref 13.0–17.7)
Immature Grans (Abs): 0 x10E3/uL (ref 0.0–0.1)
Immature Granulocytes: 0 %
Lymphocytes Absolute: 2.1 x10E3/uL (ref 0.7–3.1)
Lymphs: 42 %
MCH: 22.4 pg — ABNORMAL LOW (ref 26.6–33.0)
MCHC: 29.6 g/dL — ABNORMAL LOW (ref 31.5–35.7)
MCV: 76 fL — ABNORMAL LOW (ref 79–97)
Monocytes Absolute: 0.3 x10E3/uL (ref 0.1–0.9)
Monocytes: 7 %
Neutrophils Absolute: 2.1 x10E3/uL (ref 1.4–7.0)
Neutrophils: 42 %
Platelets: 253 x10E3/uL (ref 150–450)
RBC: 5.97 x10E6/uL — ABNORMAL HIGH (ref 4.14–5.80)
RDW: 13.7 % (ref 11.6–15.4)
WBC: 5 x10E3/uL (ref 3.4–10.8)

## 2023-11-29 LAB — LIPID PANEL
Chol/HDL Ratio: 5.1 ratio — ABNORMAL HIGH (ref 0.0–5.0)
Cholesterol, Total: 190 mg/dL (ref 100–199)
HDL: 37 mg/dL — ABNORMAL LOW (ref >39)
LDL Chol Calc (NIH): 129 mg/dL — ABNORMAL HIGH (ref 0–99)
Triglycerides: 133 mg/dL (ref 0–149)
VLDL Cholesterol Cal: 24 mg/dL (ref 5–40)

## 2023-11-29 LAB — TSH: TSH: 1.35 u[IU]/mL (ref 0.450–4.500)

## 2023-11-29 LAB — PSA: Prostate Specific Ag, Serum: 1.9 ng/mL (ref 0.0–4.0)

## 2024-01-29 ENCOUNTER — Ambulatory Visit: Admitting: Nurse Practitioner

## 2024-01-29 NOTE — Progress Notes (Deleted)
 There were no vitals taken for this visit.   Subjective:    Patient ID: Luis Clark, male    DOB: 04-05-1951, 73 y.o.   MRN: [id number]  HPI: Luis Clark is a 73 y.o. male  No chief complaint on file.  NEUROPATHY Patient states he is having more neuropathic pain.  The gabapentin  isn't helping with the pain anymore.  Continues with left sided weakness.  Neuropathy status: {Blank single:19197::controlled,uncontrolled,better,worse,exacerbated,stable}  Satisfied with current treatment?: {Blank single:19197::yes,no} Medication side effects: {Blank single:19197::yes,no} Medication compliance:  {Blank single:19197::excellent compliance,good compliance,fair compliance,poor compliance} Location:  Pain: {Blank single:19197::yes,no} Severity: {Blank single:19197::mild,moderate,severe,1/10,2/10,3/10,4/10,5/10,6/10,7/10,8/10,9/10,10/10}  Quality:  {Blank multiple:19196::sharp,dull,aching,burning,cramping,ill-defined,itchy,pressure-like,pulling,shooting,sore,stabbing,tender,tearing,throbbing,tingling,creeping,pins and needles} Frequency: {Blank single:19197::constant,intermittent,occasional,rare,every few minutes,a few times a hour,a few times a day,a few times a week,a few times a month,a few times a year} Bilateral: {Blank single:19197::yes,no} Symmetric: {Blank single:19197::yes,no} Numbness: {Blank single:19197::yes,no} Decreased sensation: {Blank single:19197::yes,no} Weakness: {Blank single:19197::yes,no} Context: {Blank multiple:19196::better,worse,stable,fluctuating}H6 Alleviating factors:  Aggravating factors:  Treatments attempted:  Relevant past medical, surgical, family and social history reviewed and updated as indicated. Interim medical history since our last visit reviewed. Allergies and medications reviewed and updated.  Review of  Systems  Per HPI unless specifically indicated above     Objective:    There were no vitals taken for this visit.  Wt Readings from Last 3 Encounters:  11/28/23 262 lb 12.8 oz (119.2 kg)  11/08/23 256 lb (116.1 kg)  03/01/23 263 lb 12.8 oz (119.7 kg)    Physical Exam  Results for orders placed or performed in visit on 11/28/23  TSH   Collection Time: 11/28/23 10:00 AM  Result Value Ref Range   TSH 1.350 0.450 - 4.500 uIU/mL  PSA   Collection Time: 11/28/23 10:00 AM  Result Value Ref Range   Prostate Specific Ag, Serum 1.9 0.0 - 4.0 ng/mL  Lipid panel   Collection Time: 11/28/23 10:00 AM  Result Value Ref Range   Cholesterol, Total 190 100 - 199 mg/dL   Triglycerides 866 0 - 149 mg/dL   HDL 37 (L) >60 mg/dL   VLDL Cholesterol Cal 24 5 - 40 mg/dL   LDL Chol Calc (NIH) 870 (H) 0 - 99 mg/dL   Chol/HDL Ratio 5.1 (H) 0.0 - 5.0 ratio  CBC with Differential/Platelet   Collection Time: 11/28/23 10:00 AM  Result Value Ref Range   WBC 5.0 3.4 - 10.8 x10E3/uL   RBC 5.97 (H) 4.14 - 5.80 x10E6/uL   Hemoglobin 13.4 13.0 - 17.7 g/dL   Hematocrit 54.7 62.4 - 51.0 %   MCV 76 (L) 79 - 97 fL   MCH 22.4 (L) 26.6 - 33.0 pg   MCHC 29.6 (L) 31.5 - 35.7 g/dL   RDW 86.2 88.3 - 84.5 %   Platelets 253 150 - 450 x10E3/uL   Neutrophils 42 Not Estab. %   Lymphs 42 Not Estab. %   Monocytes 7 Not Estab. %   Eos 8 Not Estab. %   Basos 1 Not Estab. %   Neutrophils Absolute 2.1 1.4 - 7.0 x10E3/uL   Lymphocytes Absolute 2.1 0.7 - 3.1 x10E3/uL   Monocytes Absolute 0.3 0.1 - 0.9 x10E3/uL   EOS (ABSOLUTE) 0.4 0.0 - 0.4 x10E3/uL   Basophils Absolute 0.0 0.0 - 0.2 x10E3/uL   Immature Granulocytes 0 Not Estab. %   Immature Grans (Abs) 0.0 0.0 - 0.1 x10E3/uL  Comprehensive metabolic panel with GFR   Collection Time: 11/28/23 10:00 AM  Result Value Ref Range   Glucose 111 (H) 70 -  99 mg/dL   BUN 11 8 - 27 mg/dL   Creatinine, Ser 8.93 0.76 - 1.27 mg/dL   eGFR 74 >40 fO/fpw/8.26   BUN/Creatinine  Ratio 10 10 - 24   Sodium 139 134 - 144 mmol/L   Potassium 4.4 3.5 - 5.2 mmol/L   Chloride 106 96 - 106 mmol/L   CO2 18 (L) 20 - 29 mmol/L   Calcium  9.4 8.6 - 10.2 mg/dL   Total Protein 7.4 6.0 - 8.5 g/dL   Albumin 4.1 3.8 - 4.8 g/dL   Globulin, Total 3.3 1.5 - 4.5 g/dL   Bilirubin Total 0.4 0.0 - 1.2 mg/dL   Alkaline Phosphatase 99 44 - 121 IU/L   AST 23 0 - 40 IU/L   ALT 19 0 - 44 IU/L      Assessment & Plan:   Problem List Items Addressed This Visit   None    Follow up plan: No follow-ups on file.

## 2024-02-07 ENCOUNTER — Ambulatory Visit: Admitting: Nurse Practitioner

## 2024-02-07 ENCOUNTER — Ambulatory Visit: Payer: Self-pay

## 2024-02-07 NOTE — Telephone Encounter (Signed)
 FYI Only or Action Required?: FYI only for provider.  Patient was last seen in primary care on 11/28/2023 by Melvin Pao, NP.  Called Nurse Triage reporting Foot Swelling.  Symptoms began several days ago.  Interventions attempted: Prescription medications: allopurinol .  Symptoms are: gradually worsening.  Triage Disposition: See Physician Within 24 Hours  Patient/caregiver understands and will follow disposition?: Yes  Copied from CRM #[id number]. Topic: Clinical - Red Word Triage >> Feb 07, 2024  9:09 AM Myrick T wrote: Red Word that prompted transfer to Nurse Triage: patient called to reschedule his appt stating his feet were swollen and he was unable to walk. Reason for Disposition  [1] Swollen foot AND [2] no fever  (Exceptions: Localized bump from bunions, calluses, insect bite, sting.)  Answer Assessment - Initial Assessment Questions Pt states he has appointment in two weeks, but the neuropathy pain in his feet is getting worse and this AM he noticed swelling bilaterally to feet. States his daughter will be able to take him tomorrow, and he will take gout medication in the meantime. Pt thinks combination of gout and neuropathy.   1. ONSET: When did the pain start?      This AM approx 0400 2. LOCATION: Where is the pain located?      Pain mostly to big toes but spreads to whole foot 3. PAIN: How bad is the pain?    (Scale 1-10; or mild, moderate, severe)     8/10 4. WORK OR EXERCISE: Has there been any recent work or exercise that involved this part of the body?      denies 5. CAUSE: What do you think is causing the foot pain?     Neuropathy/gout 6. OTHER SYMPTOMS: Do you have any other symptoms? (e.g., leg pain, rash, fever, numbness)     denies  Protocols used: Foot Pain-A-AH

## 2024-02-07 NOTE — Progress Notes (Deleted)
 There were no vitals taken for this visit.   Subjective:    Patient ID: Luis Clark, male    DOB: 04-05-1951, 73 y.o.   MRN: [id number]  HPI: Luis Clark is a 73 y.o. male  No chief complaint on file.  NEUROPATHY Patient states he is having more neuropathic pain.  The gabapentin  isn't helping with the pain anymore.  Continues with left sided weakness.  Neuropathy status: {Blank single:19197::controlled,uncontrolled,better,worse,exacerbated,stable}  Satisfied with current treatment?: {Blank single:19197::yes,no} Medication side effects: {Blank single:19197::yes,no} Medication compliance:  {Blank single:19197::excellent compliance,good compliance,fair compliance,poor compliance} Location:  Pain: {Blank single:19197::yes,no} Severity: {Blank single:19197::mild,moderate,severe,1/10,2/10,3/10,4/10,5/10,6/10,7/10,8/10,9/10,10/10}  Quality:  {Blank multiple:19196::sharp,dull,aching,burning,cramping,ill-defined,itchy,pressure-like,pulling,shooting,sore,stabbing,tender,tearing,throbbing,tingling,creeping,pins and needles} Frequency: {Blank single:19197::constant,intermittent,occasional,rare,every few minutes,a few times a hour,a few times a day,a few times a week,a few times a month,a few times a year} Bilateral: {Blank single:19197::yes,no} Symmetric: {Blank single:19197::yes,no} Numbness: {Blank single:19197::yes,no} Decreased sensation: {Blank single:19197::yes,no} Weakness: {Blank single:19197::yes,no} Context: {Blank multiple:19196::better,worse,stable,fluctuating}H6 Alleviating factors:  Aggravating factors:  Treatments attempted:  Relevant past medical, surgical, family and social history reviewed and updated as indicated. Interim medical history since our last visit reviewed. Allergies and medications reviewed and updated.  Review of  Systems  Per HPI unless specifically indicated above     Objective:    There were no vitals taken for this visit.  Wt Readings from Last 3 Encounters:  11/28/23 262 lb 12.8 oz (119.2 kg)  11/08/23 256 lb (116.1 kg)  03/01/23 263 lb 12.8 oz (119.7 kg)    Physical Exam  Results for orders placed or performed in visit on 11/28/23  TSH   Collection Time: 11/28/23 10:00 AM  Result Value Ref Range   TSH 1.350 0.450 - 4.500 uIU/mL  PSA   Collection Time: 11/28/23 10:00 AM  Result Value Ref Range   Prostate Specific Ag, Serum 1.9 0.0 - 4.0 ng/mL  Lipid panel   Collection Time: 11/28/23 10:00 AM  Result Value Ref Range   Cholesterol, Total 190 100 - 199 mg/dL   Triglycerides 866 0 - 149 mg/dL   HDL 37 (L) >60 mg/dL   VLDL Cholesterol Cal 24 5 - 40 mg/dL   LDL Chol Calc (NIH) 870 (H) 0 - 99 mg/dL   Chol/HDL Ratio 5.1 (H) 0.0 - 5.0 ratio  CBC with Differential/Platelet   Collection Time: 11/28/23 10:00 AM  Result Value Ref Range   WBC 5.0 3.4 - 10.8 x10E3/uL   RBC 5.97 (H) 4.14 - 5.80 x10E6/uL   Hemoglobin 13.4 13.0 - 17.7 g/dL   Hematocrit 54.7 62.4 - 51.0 %   MCV 76 (L) 79 - 97 fL   MCH 22.4 (L) 26.6 - 33.0 pg   MCHC 29.6 (L) 31.5 - 35.7 g/dL   RDW 86.2 88.3 - 84.5 %   Platelets 253 150 - 450 x10E3/uL   Neutrophils 42 Not Estab. %   Lymphs 42 Not Estab. %   Monocytes 7 Not Estab. %   Eos 8 Not Estab. %   Basos 1 Not Estab. %   Neutrophils Absolute 2.1 1.4 - 7.0 x10E3/uL   Lymphocytes Absolute 2.1 0.7 - 3.1 x10E3/uL   Monocytes Absolute 0.3 0.1 - 0.9 x10E3/uL   EOS (ABSOLUTE) 0.4 0.0 - 0.4 x10E3/uL   Basophils Absolute 0.0 0.0 - 0.2 x10E3/uL   Immature Granulocytes 0 Not Estab. %   Immature Grans (Abs) 0.0 0.0 - 0.1 x10E3/uL  Comprehensive metabolic panel with GFR   Collection Time: 11/28/23 10:00 AM  Result Value Ref Range   Glucose 111 (H) 70 -  99 mg/dL   BUN 11 8 - 27 mg/dL   Creatinine, Ser 8.93 0.76 - 1.27 mg/dL   eGFR 74 >40 fO/fpw/8.26   BUN/Creatinine  Ratio 10 10 - 24   Sodium 139 134 - 144 mmol/L   Potassium 4.4 3.5 - 5.2 mmol/L   Chloride 106 96 - 106 mmol/L   CO2 18 (L) 20 - 29 mmol/L   Calcium  9.4 8.6 - 10.2 mg/dL   Total Protein 7.4 6.0 - 8.5 g/dL   Albumin 4.1 3.8 - 4.8 g/dL   Globulin, Total 3.3 1.5 - 4.5 g/dL   Bilirubin Total 0.4 0.0 - 1.2 mg/dL   Alkaline Phosphatase 99 44 - 121 IU/L   AST 23 0 - 40 IU/L   ALT 19 0 - 44 IU/L      Assessment & Plan:   Problem List Items Addressed This Visit   None    Follow up plan: No follow-ups on file.

## 2024-02-08 ENCOUNTER — Ambulatory Visit: Admitting: Nurse Practitioner

## 2024-02-08 NOTE — Patient Instructions (Incomplete)
 Edema  Edema is when you have too much fluid in your body or under your skin. Edema may make your legs, feet, and ankles swell. Swelling often happens in looser tissues, such as around your eyes. This is a common condition. It gets more common as you get older. There are many possible causes of edema. These include: Eating too much salt (sodium). Being on your feet or sitting for a long time. Certain medical conditions, such as: Pregnancy. Heart failure. Liver disease. Kidney disease. Cancer. Hot weather may make edema worse. Edema is usually painless. Your skin may look swollen or shiny. Follow these instructions at home: Medicines Take over-the-counter and prescription medicines only as told by your doctor. Your doctor may prescribe a medicine to help your body get rid of extra water (diuretic). Take this medicine if you are told to take it. Eating and drinking Eat a low-salt (low-sodium) diet as told by your doctor. Sometimes, eating less salt may reduce swelling. Depending on the cause of your swelling, you may need to limit how much fluid you drink (fluid restriction). General instructions Raise the injured area above the level of your heart while you are sitting or lying down. Do not sit still or stand for a long time. Do not wear tight clothes. Do not wear garters on your upper legs. Exercise your legs. This can help the swelling go down. Wear compression stockings as told by your doctor. It is important that these are the right size. These should be prescribed by your doctor to prevent possible injuries. If elastic bandages or wraps are recommended, use them as told by your doctor. Contact a doctor if: Treatment is not working. You have heart, liver, or kidney disease and have symptoms of edema. You have sudden and unexplained weight gain. Get help right away if: You have shortness of breath or chest pain. You cannot breathe when you lie down. You have pain, redness, or  warmth in the swollen areas. You have heart, liver, or kidney disease and get edema all of a sudden. You have a fever and your symptoms get worse all of a sudden. These symptoms may be an emergency. Get help right away. Call 911. Do not wait to see if the symptoms will go away. Do not drive yourself to the hospital. Summary Edema is when you have too much fluid in your body or under your skin. Edema may make your legs, feet, and ankles swell. Swelling often happens in looser tissues, such as around your eyes. Raise the injured area above the level of your heart while you are sitting or lying down. Follow your doctor's instructions about diet and how much fluid you can drink. This information is not intended to replace advice given to you by your health care provider. Make sure you discuss any questions you have with your health care provider. Document Revised: 12/27/2020 Document Reviewed: 12/27/2020 Elsevier Patient Education  2024 [employer].

## 2024-02-20 ENCOUNTER — Ambulatory Visit: Admitting: Nurse Practitioner

## 2024-02-20 ENCOUNTER — Ambulatory Visit: Payer: Self-pay

## 2024-02-20 NOTE — Telephone Encounter (Signed)
 FYI

## 2024-02-20 NOTE — Telephone Encounter (Signed)
 Recommend patient make an appt.

## 2024-02-20 NOTE — Telephone Encounter (Signed)
 Pt will call back to make appt when feeling better due to COVID   FYI Only or Action Required?: FYI only for provider.  Patient was last seen in primary care on 11/28/2023 by Melvin Pao, NP.  Called Nurse Triage reporting Foot Swelling.  Symptoms began today.  Interventions attempted: Rest, hydration, or home remedies.  Symptoms are: gradually improving.  Triage Disposition: See PCP Within 2 Weeks  Patient/caregiver understands and will follow disposition?: Yes     Copied from CRM [id number]. Topic: Clinical - Red Word Triage >> Feb 20, 2024  7:47 AM Willma R wrote: Red Word that prompted transfer to Nurse Triage: Patient has an appointment for this afternoon that he would like to reschedule because tested positive for Covid, but stated he has new/worse symptom with swelling in his left foot. Reason for Disposition  [1] MILD swelling of both ankles (i.e., pedal edema) AND [2] varicose veins    St present time pt states mild swelling since he has been elevating the area.  Pt states the more he stands on it - the swelling increases.  Answer Assessment - Initial Assessment Questions 1. ONSET: When did the swelling start? (e.g., minutes, hours, days)     yesterday 2. LOCATION: What part of the leg is swollen?  Are both legs swollen or just one leg?     Left foot 3. SEVERITY: How bad is the swelling? (e.g., localized; mild, moderate, severe)     Moderate but when pt elevated leg swelling went down 4. REDNESS: Is there redness or signs of infection?     no 5. PAIN: Is the swelling painful to touch? If Yes, ask: How painful is it?   (Scale 1-10; mild, moderate or severe)     6/10 comes and goes - pain and swelling when standing 6. FEVER: Do you have a fever? If Yes, ask: What is it, how was it measured, and when did it start?    Low grade with COVID positive 7. CAUSE: What do you think is causing the leg swelling?     unknown 8. MEDICAL HISTORY: Do you  have a history of blood clots (e.g., DVT), cancer, heart failure, kidney disease, or liver failure?     na 9. RECURRENT SYMPTOM: Have you had leg swelling before? If Yes, ask: When was the last time? What happened that time?     Yes but not  10. OTHER SYMPTOMS: Do you have any other symptoms? (e.g., chest pain, difficulty breathing)       SOB with exertion 11. PREGNANCY: Is there any chance you are pregnant? When was your last menstrual period?       Na  Pt did not want to reschedule due to having COVID-stated when he is better he will call us  back.  Protocols used: Leg Swelling and Edema-A-AH

## 2024-02-25 ENCOUNTER — Telehealth: Payer: Self-pay | Admitting: Nurse Practitioner

## 2024-02-25 ENCOUNTER — Other Ambulatory Visit: Payer: Self-pay | Admitting: Nurse Practitioner

## 2024-02-25 DIAGNOSIS — M10071 Idiopathic gout, right ankle and foot: Secondary | ICD-10-CM

## 2024-02-25 NOTE — Telephone Encounter (Signed)
  pregabalin  (LYRICA ) 25 MG capsule 25 mg, 2 times daily

## 2024-02-26 NOTE — Telephone Encounter (Signed)
 Patient is overdue for an appointment. Was due for a 2 month f/up around 01/29/24. Please call to schedule and then route to provider for refill.

## 2024-02-27 NOTE — Telephone Encounter (Signed)
 Called patient, but call could not be completed as dialed.

## 2024-02-27 NOTE — Telephone Encounter (Signed)
 Requested medication (s) are due for refill today: na   Requested medication (s) are on the active medication list: yes   Last refill:  11/28/23 #90 1 refills  Future visit scheduled: no   Notes to clinic:  last refill 02/25/24. Pharmacy requesting refills for 1 year. Do you want to refill Rxs for 1 year #100?     Requested Prescriptions  Pending Prescriptions Disp Refills   atorvastatin  (LIPITOR) 20 MG tablet [Pharmacy Med Name: Atorvastatin  Calcium  20 MG Oral Tablet] 90 tablet 3    Sig: TAKE 1 TABLET BY MOUTH DAILY     Cardiovascular:  Antilipid - Statins Failed - 02/27/2024 12:44 PM      Failed - Lipid Panel in normal range within the last 12 months    Cholesterol, Total  Date Value Ref Range Status  11/28/2023 190 100 - 199 mg/dL Final   LDL Chol Calc (NIH)  Date Value Ref Range Status  11/28/2023 129 (H) 0 - 99 mg/dL Final   HDL  Date Value Ref Range Status  11/28/2023 37 (L) >39 mg/dL Final   Triglycerides  Date Value Ref Range Status  11/28/2023 133 0 - 149 mg/dL Final         Passed - Patient is not pregnant      Passed - Valid encounter within last 12 months    Recent Outpatient Visits           3 months ago Annual physical exam   [facility] Melvin Pao, NP               DULoxetine  (CYMBALTA ) 60 MG capsule [Pharmacy Med Name: DULoxetine  HCl 60 MG Oral Capsule Delayed Release Particles] 90 capsule 3    Sig: TAKE 1 CAPSULE BY MOUTH DAILY     Psychiatry: Antidepressants - SNRI - duloxetine  Passed - 02/27/2024 12:44 PM      Passed - Cr in normal range and within 360 days    Creatinine, Ser  Date Value Ref Range Status  11/28/2023 1.06 0.76 - 1.27 mg/dL Final         Passed - eGFR is 30 or above and within 360 days    GFR, Estimated  Date Value Ref Range Status  09/09/2020 >60 >60 mL/min Final    Comment:    (NOTE) Calculated using the CKD-EPI Creatinine Equation (2021) Performed at [facility], [address] [place], [place] [address]    eGFR  Date Value Ref Range Status  11/28/2023 74 >59 mL/min/1.73 Final         Passed - Completed PHQ-2 or PHQ-9 in the last 360 days      Passed - Last BP in normal range    BP Readings from Last 1 Encounters:  11/28/23 114/72         Passed - Valid encounter within last 6 months    Recent Outpatient Visits           3 months ago Annual physical exam   [facility] Melvin Pao, NP               omeprazole  (PRILOSEC) 20 MG capsule [Pharmacy Med Name: Omeprazole  20 MG Oral Capsule Delayed Release] 90 capsule 3    Sig: TAKE 1 CAPSULE BY MOUTH DAILY     Gastroenterology: Proton Pump Inhibitors Passed - 02/27/2024 12:44 PM      Passed - Valid encounter within last 12 months    Recent  Outpatient Visits           3 months ago Annual physical exam   [facility] Melvin Pao, NP               allopurinol  (ZYLOPRIM ) 100 MG tablet [Pharmacy Med Name: Allopurinol  100 MG Oral Tablet] 90 tablet 3    Sig: TAKE 1 TABLET BY MOUTH DAILY     Endocrinology:  Gout Agents - allopurinol  Failed - 02/27/2024 12:44 PM      Failed - Uric Acid in normal range and within 360 days    Uric Acid  Date Value Ref Range Status  03/01/2023 5.9 3.8 - 8.4 mg/dL Final    Comment:               Therapeutic target for gout patients: <6.0         Passed - Cr in normal range and within 360 days    Creatinine, Ser  Date Value Ref Range Status  11/28/2023 1.06 0.76 - 1.27 mg/dL Final         Passed - Valid encounter within last 12 months    Recent Outpatient Visits           3 months ago Annual physical exam   [facility] Melvin Pao, NP              Passed - CBC within normal limits and completed in the last 12 months    WBC  Date Value Ref Range Status  11/28/2023 5.0 3.4 - 10.8 x10E3/uL Final  09/09/2020 8.4 4.0 - 10.5 K/uL Final   RBC  Date Value  Ref Range Status  11/28/2023 5.97 (H) 4.14 - 5.80 x10E6/uL Final  09/09/2020 5.43 4.22 - 5.81 MIL/uL Final   Hemoglobin  Date Value Ref Range Status  11/28/2023 13.4 13.0 - 17.7 g/dL Final   Hematocrit  Date Value Ref Range Status  11/28/2023 45.2 37.5 - 51.0 % Final   MCHC  Date Value Ref Range Status  11/28/2023 29.6 (L) 31.5 - 35.7 g/dL Final  94/94/7977 69.1 30.0 - 36.0 g/dL Final   [facility]  Date Value Ref Range Status  11/28/2023 22.4 (L) 26.6 - 33.0 pg Final  09/09/2020 22.3 (L) 26.0 - 34.0 pg Final   MCV  Date Value Ref Range Status  11/28/2023 76 (L) 79 - 97 fL Final   No results found for: PLTCOUNTKUC, LABPLAT, POCPLA RDW  Date Value Ref Range Status  11/28/2023 13.7 11.6 - 15.4 % Final          amLODipine  (NORVASC ) 10 MG tablet [Pharmacy Med Name: amLODIPine  Besylate 10 MG Oral Tablet] 90 tablet 3    Sig: TAKE 1 TABLET BY MOUTH DAILY     Cardiovascular: Calcium  Channel Blockers 2 Passed - 02/27/2024 12:44 PM      Passed - Last BP in normal range    BP Readings from Last 1 Encounters:  11/28/23 114/72         Passed - Last Heart Rate in normal range    Pulse Readings from Last 1 Encounters:  11/28/23 99         Passed - Valid encounter within last 6 months    Recent Outpatient Visits           3 months ago Annual physical exam   [facility] Melvin Pao, NP               tamsulosin  (  FLOMAX ) 0.4 MG CAPS capsule [Pharmacy Med Name: Tamsulosin  HCl 0.4 MG Oral Capsule] 90 capsule 3    Sig: TAKE 1 CAPSULE BY MOUTH DAILY     Urology: Alpha-Adrenergic Blocker Passed - 02/27/2024 12:44 PM      Passed - PSA in normal range and within 360 days    Prostate Specific Ag, Serum  Date Value Ref Range Status  11/28/2023 1.9 0.0 - 4.0 ng/mL Final    Comment:    Roche ECLIA methodology. According to the American Urological Association, Serum PSA should decrease and remain at undetectable levels after  radical prostatectomy. The AUA defines biochemical recurrence as an initial PSA value 0.2 ng/mL or greater followed by a subsequent confirmatory PSA value 0.2 ng/mL or greater. Values obtained with different assay methods or kits cannot be used interchangeably. Results cannot be interpreted as absolute evidence of the presence or absence of malignant disease.          Passed - Last BP in normal range    BP Readings from Last 1 Encounters:  11/28/23 114/72         Passed - Valid encounter within last 12 months    Recent Outpatient Visits           3 months ago Annual physical exam   [place] [facility] Melvin Pao, NP

## 2024-03-07 MED ORDER — PREGABALIN 25 MG PO CAPS: 25.0000 mg | ORAL_CAPSULE | Freq: Two times a day (BID) | ORAL | 0 refills | Status: DC

## 2024-03-13 ENCOUNTER — Other Ambulatory Visit: Payer: Self-pay

## 2024-03-13 MED ORDER — PREGABALIN 25 MG PO CAPS: 25.0000 mg | ORAL_CAPSULE | Freq: Two times a day (BID) | ORAL | 0 refills | Status: DC

## 2024-03-13 NOTE — Telephone Encounter (Signed)
 Patient needs RX to go to [facility].

## 2024-03-19 ENCOUNTER — Other Ambulatory Visit: Payer: Self-pay

## 2024-03-19 MED ORDER — PREGABALIN 25 MG PO CAPS: 25.0000 mg | ORAL_CAPSULE | Freq: Two times a day (BID) | ORAL | 0 refills | Status: AC

## 2024-03-19 NOTE — Telephone Encounter (Signed)
 RX was last sent to local pharmacy. Can we resend to mail order?

## 2024-03-21 ENCOUNTER — Ambulatory Visit: Admitting: Nurse Practitioner

## 2024-03-21 NOTE — Progress Notes (Deleted)
 There were no vitals taken for this visit.   Subjective:    Patient ID: Luis Clark, male    DOB: 1951/03/30, 73 y.o.   MRN: [id number]  HPI: Luis Clark is a 73 y.o. male  No chief complaint on file.  NEUROPATHY Neuropathy status: {Blank single:19197::controlled,uncontrolled,better,worse,exacerbated,stable}  Satisfied with current treatment?: {Blank single:19197::yes,no} Medication side effects: {Blank single:19197::yes,no} Medication compliance:  {Blank single:19197::excellent compliance,good compliance,fair compliance,poor compliance} Location:  Pain: {Blank single:19197::yes,no} Severity: {Blank single:19197::mild,moderate,severe,1/10,2/10,3/10,4/10,5/10,6/10,7/10,8/10,9/10,10/10}  Quality:  {Blank multiple:19196::sharp,dull,aching,burning,cramping,ill-defined,itchy,pressure-like,pulling,shooting,sore,stabbing,tender,tearing,throbbing,tingling,creeping,pins and needles} Frequency: {Blank single:19197::constant,intermittent,occasional,rare,every few minutes,a few times a hour,a few times a day,a few times a week,a few times a month,a few times a year} Bilateral: {Blank single:19197::yes,no} Symmetric: {Blank single:19197::yes,no} Numbness: {Blank single:19197::yes,no} Decreased sensation: {Blank single:19197::yes,no} Weakness: {Blank single:19197::yes,no} Context: {Blank multiple:19196::better,worse,stable,fluctuating}H6 Alleviating factors:  Aggravating factors:  Treatments attempted:  Relevant past medical, surgical, family and social history reviewed and updated as indicated. Interim medical history since our last visit reviewed. Allergies and medications reviewed and updated.  Review of Systems  Per HPI unless specifically indicated above     Objective:    There were no vitals taken for this visit.  Wt Readings from Last  3 Encounters:  11/28/23 262 lb 12.8 oz (119.2 kg)  11/08/23 256 lb (116.1 kg)  03/01/23 263 lb 12.8 oz (119.7 kg)    Physical Exam  Results for orders placed or performed in visit on 11/28/23  TSH   Collection Time: 11/28/23 10:00 AM  Result Value Ref Range   TSH 1.350 0.450 - 4.500 uIU/mL  PSA   Collection Time: 11/28/23 10:00 AM  Result Value Ref Range   Prostate Specific Ag, Serum 1.9 0.0 - 4.0 ng/mL  Lipid panel   Collection Time: 11/28/23 10:00 AM  Result Value Ref Range   Cholesterol, Total 190 100 - 199 mg/dL   Triglycerides 866 0 - 149 mg/dL   HDL 37 (L) >60 mg/dL   VLDL Cholesterol Cal 24 5 - 40 mg/dL   LDL Chol Calc (NIH) 870 (H) 0 - 99 mg/dL   Chol/HDL Ratio 5.1 (H) 0.0 - 5.0 ratio  CBC with Differential/Platelet   Collection Time: 11/28/23 10:00 AM  Result Value Ref Range   WBC 5.0 3.4 - 10.8 x10E3/uL   RBC 5.97 (H) 4.14 - 5.80 x10E6/uL   Hemoglobin 13.4 13.0 - 17.7 g/dL   Hematocrit 54.7 62.4 - 51.0 %   MCV 76 (L) 79 - 97 fL   MCH 22.4 (L) 26.6 - 33.0 pg   MCHC 29.6 (L) 31.5 - 35.7 g/dL   RDW 86.2 88.3 - 84.5 %   Platelets 253 150 - 450 x10E3/uL   Neutrophils 42 Not Estab. %   Lymphs 42 Not Estab. %   Monocytes 7 Not Estab. %   Eos 8 Not Estab. %   Basos 1 Not Estab. %   Neutrophils Absolute 2.1 1.4 - 7.0 x10E3/uL   Lymphocytes Absolute 2.1 0.7 - 3.1 x10E3/uL   Monocytes Absolute 0.3 0.1 - 0.9 x10E3/uL   EOS (ABSOLUTE) 0.4 0.0 - 0.4 x10E3/uL   Basophils Absolute 0.0 0.0 - 0.2 x10E3/uL   Immature Granulocytes 0 Not Estab. %   Immature Grans (Abs) 0.0 0.0 - 0.1 x10E3/uL  Comprehensive metabolic panel with GFR   Collection Time: 11/28/23 10:00 AM  Result Value Ref Range   Glucose 111 (H) 70 - 99 mg/dL   BUN 11 8 - 27 mg/dL   Creatinine, Ser 8.93 0.76 - 1.27 mg/dL   eGFR 74 >40  mL/min/1.73   BUN/Creatinine Ratio 10 10 - 24   Sodium 139 134 - 144 mmol/L   Potassium 4.4 3.5 - 5.2 mmol/L   Chloride 106 96 - 106 mmol/L   CO2 18 (L) 20 - 29 mmol/L    Calcium  9.4 8.6 - 10.2 mg/dL   Total Protein 7.4 6.0 - 8.5 g/dL   Albumin 4.1 3.8 - 4.8 g/dL   Globulin, Total 3.3 1.5 - 4.5 g/dL   Bilirubin Total 0.4 0.0 - 1.2 mg/dL   Alkaline Phosphatase 99 44 - 121 IU/L   AST 23 0 - 40 IU/L   ALT 19 0 - 44 IU/L      Assessment & Plan:   Problem List Items Addressed This Visit   None    Follow up plan: No follow-ups on file.

## 2024-04-27 ENCOUNTER — Other Ambulatory Visit: Payer: Self-pay | Admitting: Nurse Practitioner

## 2024-04-27 DIAGNOSIS — M10071 Idiopathic gout, right ankle and foot: Secondary | ICD-10-CM

## 2024-11-20 ENCOUNTER — Ambulatory Visit
